# Patient Record
Sex: Male | Born: 1960 | Race: White | Hispanic: No | Marital: Married | Smoking: Former smoker
Health system: Southern US, Community
[De-identification: ages and names within clinical notes are randomized; demographics above are authoritative.]

## PROBLEM LIST (undated history)

## (undated) DIAGNOSIS — I10 Essential (primary) hypertension: Secondary | ICD-10-CM

## (undated) HISTORY — PX: KNEE SURGERY: SHX244

## (undated) NOTE — *Deleted (*Deleted)
NEUROLOGY CONSULTATION NOTE   Date of service: July 25, 2020 Patient Name: Larry Vega MRN:  [id number] DOB:  August 23, 1961 Reason for consult: "L sided numbness"  History of Present Illness  Larry Vega is a 13 y.o. male with PMH significant for Hypertension who presents with  L facial numbness and having to drink through the R side of the mouth and subjective left eye twitch. 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 He was given Aspirin and Plavix in the ED.  Suspicion for potential symptomatic stenosis vs stroke and therefore he was transferred to [facility] for an MRI Brain and further workup.   ROS   Constitutional Denies weight loss, fever and chills. ***  HEENT Denies changes in vision and hearing. ***  Respiratory Denies SOB and cough. ***  CV Denies palpitations and CP ***  GI Denies abdominal pain, nausea, vomiting and diarrhea. ***  GU Denies dysuria and urinary frequency. ***  MSK Denies myalgia and joint pain. ***  Skin Denies rash and pruritus. ***  Neurological Denies headache and syncope. ***  Psychiatric Denies recent changes in mood. Denies anxiety and depression. ***   Past History   Past Medical History:  Diagnosis Date  . Hypertension    Past Surgical History:  Procedure Laterality Date  . KNEE SURGERY     Family History  Problem Relation Age of Onset  . Cancer Mother   . Hypertension Neg Hx   . Diabetes Neg Hx   . Hyperlipidemia Neg Hx   . Heart disease Neg Hx    Social History   Socioeconomic History  . Marital status: Married    Spouse name: Not on file  . Number of children: Not on file  . Years of education: Not on file  . Highest education level: Not on file  Occupational History  . Not on file  Tobacco Use  . Smoking status: Light Tobacco Smoker  . Smokeless tobacco: Never Used  Substance and Sexual Activity  . Alcohol use: Yes     Comment: occasionally  . Drug use: No  . Sexual activity: Not on file  Other Topics Concern  . Not on file  Social History Narrative  . Not on file   Social Determinants of Health   Financial Resource Strain:   . Difficulty of Paying Living Expenses: Not on file  Food Insecurity:   . Worried About Programme researcher, broadcasting/film/video in the Last Year: Not on file  . Ran Out of Food in the Last Year: Not on file  Transportation Needs:   . Lack of Transportation (Medical): Not on file  . Lack of Transportation (Non-Medical): Not on file  Physical Activity:   . Days of Exercise per Week: Not on file  . Minutes of Exercise per Session: Not on file  Stress:   . Feeling of Stress : Not on file  Social Connections:   . Frequency of Communication with Friends and Family: Not on file  . Frequency of Social Gatherings with Friends and Family: Not on file  . Attends Religious Services: Not on file  . Active Member of Clubs or Organizations: Not on file  . Attends Banker Meetings: Not on file  . Marital Status: Not on file   Allergies  Allergen Reactions  . Penicillins     Unknown = severe   . Sulfa Antibiotics  Anaphylaxis     Medications   Medications Prior to Admission  Medication Sig Dispense Refill Last Dose  . azithromycin (ZITHROMAX) 250 MG tablet Take 1 tablet (250 mg total) by mouth daily. 4 tablet 0      Vitals   Vitals:   07/25/20 1700 07/25/20 1915 07/25/20 1957 07/25/20 2105  BP: (!) 148/78 138/84 (!) 161/81 (!) 163/83  Pulse: 73 67 65 62  Resp: (!) 25 (!) 21 15 (!) 22  Temp:      TempSrc:      SpO2: 95% 98% 99% 98%  Weight:      Height:         Body mass index is 33.4 kg/m.  Physical Exam   General: Laying comfortably in bed; in no acute distress. *** HENT: Normal oropharynx and mucosa. Normal external appearance of ears and nose. *** Neck: Supple, no pain or tenderness *** CV: No JVD. No peripheral edema. *** Pulmonary: Symmetric Chest rise.  Normal respiratory effort. *** Abdomen: Soft to touch, non-tender. *** Ext: No cyanosis, edema, or deformity *** Skin: No rash. Normal palpation of skin.  *** Musculoskeletal: Normal digits and nails by inspection. No clubbing. ***  Neurologic Examination  Mental status/Cognition: Alert, oriented to self, place, month and year, good attention. *** Speech/language: Fluent, comprehension intact, object naming intact, repetition intact. *** Cranial nerves:   CN II Pupils equal and reactive to light, no VF deficits ***   CN III,IV,VI EOM intact, no gaze preference or deviation, no nystagmus ***   CN V normal sensation in V1, V2, and V3 segments bilaterally ***   CN VII no asymmetry, no nasolabial fold flattening ***   CN VIII normal hearing to speech ***   CN IX & X normal palatal elevation, no uvular deviation ***   CN XI 5/5 head turn and 5/5 shoulder shrug bilaterally ***   CN XII midline tongue protrusion ***   Motor:  Muscle bulk: ***, tone ***, pronator drift *** tremor *** Mvmt Root Nerve  Muscle Right Left Comments  SA C5/6 Ax Deltoid     EF C5/6 Mc Biceps     EE C6/7/8 Rad Triceps     WF C6/7 Med FCR     WE C7/8 PIN ECU     F Ab C8/T1 U ADM/FDI     HF L1/2/3 Fem Illopsoas     KE L2/3/4 Fem Quad     DF L4/5 D Peron Tib Ant     PF S1/2 Tibial Grc/Sol      Reflexes:  Right Left Comments  Pectoralis      Biceps (C5/6)     Brachioradialis (C5/6)      Triceps (C6/7)      Patellar (L3/4)      Achilles (S1)      Hoffman      Plantar     Jaw jerk    Sensation:  Light touch    Pin prick    Temperature    Vibration   Proprioception    Coordination/Complex Motor:  - Finger to Nose *** - Heel to shin *** - Rapid alternating movement *** - Gait: Stride length ***. Arm swing ***. Base width ***  Labs   CBC:  Recent Labs  Lab 07/25/20 1459  WBC 6.8  NEUTROABS 4.1  HGB 13.7  HCT 40.8  MCV 98.3  PLT 199    Basic Metabolic Panel:  Lab Results  Component  Value Date   NA 139 07/25/2020  K 3.9 07/25/2020   CO2 26 07/25/2020   GLUCOSE 112 (H) 07/25/2020   BUN 18 07/25/2020   CREATININE 1.12 07/25/2020   CALCIUM 9.0 07/25/2020   GFRNONAA >60 07/25/2020   Lipid Panel: No results found for: LDLCALC HgbA1c: No results found for: HGBA1C Urine Drug Screen: No results found for: LABOPIA, COCAINSCRNUR, LABBENZ, AMPHETMU, THCU, LABBARB  Alcohol Level     Component Value Date/Time   ETH <10 07/25/2020 1459     Results for orders placed during the hospital encounter of 07/25/20  CT Angio Neck W and/or Wo Contrast  Narrative CLINICAL DATA:  Left facial twitching and numbness.  EXAM: CT ANGIOGRAPHY HEAD AND NECK  TECHNIQUE: Multidetector CT imaging of the head and neck was performed using the standard protocol during bolus administration of intravenous contrast. Multiplanar CT image reconstructions and MIPs were obtained to evaluate the vascular anatomy. Carotid stenosis measurements (when applicable) are obtained utilizing NASCET criteria, using the distal internal carotid diameter as the denominator.  CONTRAST:  OMNIPAQUE IOHEXOL 350 MG/ML SOLN  COMPARISON:  None.  FINDINGS: CTA NECK FINDINGS  Aortic arch: Standard 3 vessel aortic arch with widely patent arch vessel origins.  Right carotid system: Patent with mild calcified and soft plaque at the carotid bifurcation. No evidence of significant stenosis or dissection.  Left carotid system: Patent with mild calcified and soft plaque at the carotid bifurcation. No evidence of significant stenosis or dissection.  Vertebral arteries: Patent and codominant. Suboptimal assessment of the vertebral artery origins bilaterally due to shoulder artifact. No evidence of significant stenosis or dissection more distally.  Skeleton: Mild cervical spondylosis and facet arthrosis.  Other neck: No evidence of cervical lymphadenopathy or mass.  Upper chest: Clear lung apices.   Review of the MIP images confirms the above findings  CTA HEAD FINDINGS  Anterior circulation: The internal carotid arteries are patent from skull base to carotid termini with minimal nonstenotic plaque. ACAs and MCAs are patent without evidence of a proximal branch occlusion or significant A1 or M1 stenosis. There is a moderate to severe stenosis of the right M2 superior division proximally. No aneurysm is identified.  Posterior circulation: The intracranial vertebral arteries are widely patent to the basilar. Patent PICA and SCA origins are identified bilaterally. The basilar artery is widely patent. There is a fetal origin of the right PCA. Both PCAs are patent without evidence of a significant proximal stenosis. No aneurysm is identified.  Venous sinuses: Patent.  Anatomic variants: Fetal right PCA.  Review of the MIP images confirms the above findings  IMPRESSION: 1. No large vessel occlusion. 2. 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 3. Mild cervical carotid artery atherosclerosis without stenosis.   Electronically Signed By: Sebastian Ache M.D. On: 07/25/2020 18:55    Impression   Tierra Divelbiss is a 43 y.o. male with PMH significant for ***. His neurologic examination is notable for ***.  Recommendations  *** ______________________________________________________________________   Thank you for the opportunity to take part in the care of this patient. If you have any further questions, please contact the neurology consultation attending.  Signed,  Erick Blinks [facility] Pager Number [phone]

---

## 2013-11-02 ENCOUNTER — Encounter (HOSPITAL_BASED_OUTPATIENT_CLINIC_OR_DEPARTMENT_OTHER): Payer: Self-pay | Admitting: Emergency Medicine

## 2013-11-02 ENCOUNTER — Emergency Department (HOSPITAL_BASED_OUTPATIENT_CLINIC_OR_DEPARTMENT_OTHER)
Admission: EM | Admit: 2013-11-02 | Discharge: 2013-11-02 | Disposition: A | Discharge status: Home / Self Care | Payer: Managed Care, Other (non HMO) | Attending: Emergency Medicine | Admitting: Emergency Medicine

## 2013-11-02 DIAGNOSIS — I10 Essential (primary) hypertension: Secondary | ICD-10-CM | POA: Insufficient documentation

## 2013-11-02 DIAGNOSIS — R51 Headache: Secondary | ICD-10-CM | POA: Insufficient documentation

## 2013-11-02 DIAGNOSIS — Z880 Allergy status to penicillin: Secondary | ICD-10-CM | POA: Insufficient documentation

## 2013-11-02 DIAGNOSIS — R519 Headache, unspecified: Secondary | ICD-10-CM

## 2013-11-02 DIAGNOSIS — F17200 Nicotine dependence, unspecified, uncomplicated: Secondary | ICD-10-CM | POA: Insufficient documentation

## 2013-11-02 DIAGNOSIS — J329 Chronic sinusitis, unspecified: Secondary | ICD-10-CM | POA: Insufficient documentation

## 2013-11-02 MED ORDER — AZITHROMYCIN 250 MG PO TABS: 250.0000 mg | ORAL_TABLET | Freq: Every day | ORAL | Status: DC

## 2013-11-02 MED ORDER — AZITHROMYCIN 250 MG PO TABS
500.0000 mg | ORAL_TABLET | Freq: Once | ORAL | Status: AC
  Administered 2013-11-02: 500 mg via ORAL
  Filled 2013-11-02: qty 2

## 2013-11-02 NOTE — ED Notes (Signed)
Pt c/o migraine, sinus pressure, nasal congestion, states he took BP at home and it was 175/104 at home.

## 2013-11-02 NOTE — ED Provider Notes (Signed)
CSN: [id number]     Arrival date & time 11/02/13  4098 History   First MD Initiated Contact with Patient 11/02/13 0441     Chief Complaint  Patient presents with  . Nasal Congestion  . Headache  . Hypertension     (Consider location/radiation/quality/duration/timing/severity/associated sxs/prior Treatment) HPI Comments: Patient is a 53 year old male with past medical history of deviated septum.  He presents today with complaints of sinus congestion and headache that has been ongoing for the past week and getting worse. He denies any fevers or chills. He woke from sleep this evening and felt congested and as if he could not breathe. He checked his blood pressure and it was found to be elevated. He was concerned about this so presents here for evaluation. He denies any chest pain or shortness of breath. He does not have any history of hypertension and is on no medications.  Patient is a 53 y.o. male presenting with headaches and hypertension. The history is provided by the patient.  Headache Pain location:  Frontal Quality:  Dull Radiates to:  Does not radiate Onset quality:  Gradual Duration:  1 week Timing:  Constant Progression:  Worsening Chronicity:  New Similar to prior headaches: yes   Relieved by:  Nothing Worsened by:  Nothing tried Ineffective treatments:  None tried Hypertension Associated symptoms include headaches.    History reviewed. No pertinent past medical history. Past Surgical History  Procedure Laterality Date  . Knee surgery     History reviewed. No pertinent family history. History  Substance Use Topics  . Smoking status: Light Tobacco Smoker  . Smokeless tobacco: Not on file  . Alcohol Use: Yes     Comment: occasionally    Review of Systems  Neurological: Positive for headaches.  All other systems reviewed and are negative.      Allergies  Penicillins and Sulfa antibiotics  Home Medications  No current outpatient prescriptions on  file. BP 183/94  Pulse 86  Temp(Src) 97.6 F (36.4 C) (Oral)  Resp 18  Ht 5\' 9"  (1.753 m)  Wt 300 lb (136.079 kg)  BMI 44.28 kg/m2  SpO2 96% Physical Exam  Nursing note reviewed. Constitutional: He is oriented to person, place, and time. He appears well-developed and well-nourished. No distress.  HENT:  Head: Normocephalic and atraumatic.  Mouth/Throat: Oropharynx is clear and moist.  Eyes: EOM are normal. Pupils are equal, round, and reactive to light.  Neck: Normal range of motion. Neck supple.  Cardiovascular: Normal rate, regular rhythm and normal heart sounds.   No murmur heard. Pulmonary/Chest: Breath sounds normal. No respiratory distress. He has no wheezes.  Abdominal: Soft. Bowel sounds are normal. He exhibits no distension. There is no tenderness.  Musculoskeletal: Normal range of motion. He exhibits no edema.  Lymphadenopathy:    He has no cervical adenopathy.  Neurological: He is alert and oriented to person, place, and time. No cranial nerve deficit. Coordination normal.  Skin: Skin is warm and dry. He is not diaphoretic.    ED Course  Procedures (including critical care time) Labs Review Labs Reviewed - No data to display Imaging Review No results found.    MDM   Final diagnoses:  None    Patient presents here with complaints of elevated blood pressure, headache, and sinus congestion. He believes he has a sinus infection. This will be treated with Zithromax. He is also concerned about his blood pressure. It was initially elevated, however after being triaged significantly improved. While I examined him  his blood pressure was 158/89. I do not feel that this warrants any emergent treatment however I have advised him to keep a record of this and discuss the results with his primary Dr. Neurologic examination is nonfocal and I see no indication for further imaging.    Geoffery Lyonsouglas Desirai Traxler, MD 11/02/13 [phone]

## 2013-11-02 NOTE — Discharge Instructions (Signed)
Zithromax as prescribed.  Measure your blood pressure regularly for the next several days and follow up these results with your primary Dr.  Return to the emergency department if you develop severe headache, high fever, or other new or concerning symptoms   Sinusitis Sinusitis is redness, soreness, and swelling (inflammation) of the paranasal sinuses. Paranasal sinuses are air pockets within the bones of your face (beneath the eyes, the middle of the forehead, or above the eyes). In healthy paranasal sinuses, mucus is able to drain out, and air is able to circulate through them by way of your nose. However, when your paranasal sinuses are inflamed, mucus and air can become trapped. This can allow bacteria and other germs to grow and cause infection. Sinusitis can develop quickly and last only a short time (acute) or continue over a long period (chronic). Sinusitis that lasts for more than 12 weeks is considered chronic.  CAUSES  Causes of sinusitis include:  Allergies.  Structural abnormalities, such as displacement of the cartilage that separates your nostrils (deviated septum), which can decrease the air flow through your nose and sinuses and affect sinus drainage.  Functional abnormalities, such as when the small hairs (cilia) that line your sinuses and help remove mucus do not work properly or are not present. SYMPTOMS  Symptoms of acute and chronic sinusitis are the same. The primary symptoms are pain and pressure around the affected sinuses. Other symptoms include:  Upper toothache.  Earache.  Headache.  Bad breath.  Decreased sense of smell and taste.  A cough, which worsens when you are lying flat.  Fatigue.  Fever.  Thick drainage from your nose, which often is green and may contain pus (purulent).  Swelling and warmth over the affected sinuses. DIAGNOSIS  Your caregiver will perform a physical exam. During the exam, your caregiver may:  Look in your nose for signs  of abnormal growths in your nostrils (nasal polyps).  Tap over the affected sinus to check for signs of infection.  View the inside of your sinuses (endoscopy) with a special imaging device with a light attached (endoscope), which is inserted into your sinuses. If your caregiver suspects that you have chronic sinusitis, one or more of the following tests may be recommended:  Allergy tests.  Nasal culture A sample of mucus is taken from your nose and sent to a lab and screened for bacteria.  Nasal cytology A sample of mucus is taken from your nose and examined by your caregiver to determine if your sinusitis is related to an allergy. TREATMENT  Most cases of acute sinusitis are related to a viral infection and will resolve on their own within 10 days. Sometimes medicines are prescribed to help relieve symptoms (pain medicine, decongestants, nasal steroid sprays, or saline sprays).  However, for sinusitis related to a bacterial infection, your caregiver will prescribe antibiotic medicines. These are medicines that will help kill the bacteria causing the infection.  Rarely, sinusitis is caused by a fungal infection. In theses cases, your caregiver will prescribe antifungal medicine. For some cases of chronic sinusitis, surgery is needed. Generally, these are cases in which sinusitis recurs more than 3 times per year, despite other treatments. HOME CARE INSTRUCTIONS   Drink plenty of water. Water helps thin the mucus so your sinuses can drain more easily.  Use a humidifier.  Inhale steam 3 to 4 times a day (for example, sit in the bathroom with the shower running).  Apply a warm, moist washcloth to your face  3 to 4 times a day, or as directed by your caregiver.  Use saline nasal sprays to help moisten and clean your sinuses.  Take over-the-counter or prescription medicines for pain, discomfort, or fever only as directed by your caregiver. SEEK IMMEDIATE MEDICAL CARE IF:  You have  increasing pain or severe headaches.  You have nausea, vomiting, or drowsiness.  You have swelling around your face.  You have vision problems.  You have a stiff neck.  You have difficulty breathing. MAKE SURE YOU:   Understand these instructions.  Will watch your condition.  Will get help right away if you are not doing well or get worse. Document Released: 08/28/2005 Document Revised: 11/20/2011 Document Reviewed: 09/12/2011 [facility] Patient Information 2014 [place], [place].

## 2020-07-22 DIAGNOSIS — I10 Essential (primary) hypertension: Secondary | ICD-10-CM | POA: Diagnosis not present

## 2020-07-22 DIAGNOSIS — Z760 Encounter for issue of repeat prescription: Secondary | ICD-10-CM | POA: Diagnosis not present

## 2020-07-25 ENCOUNTER — Inpatient Hospital Stay (HOSPITAL_BASED_OUTPATIENT_CLINIC_OR_DEPARTMENT_OTHER)
Admission: EM | Admit: 2020-07-25 | Discharge: 2020-07-26 | DRG: 923 | Disposition: A | Discharge status: Home / Self Care | Payer: Self-pay | Attending: Internal Medicine | Admitting: Internal Medicine

## 2020-07-25 ENCOUNTER — Inpatient Hospital Stay (HOSPITAL_COMMUNITY): Payer: Managed Care, Other (non HMO)

## 2020-07-25 ENCOUNTER — Other Ambulatory Visit: Payer: Self-pay

## 2020-07-25 ENCOUNTER — Emergency Department (HOSPITAL_BASED_OUTPATIENT_CLINIC_OR_DEPARTMENT_OTHER): Payer: Managed Care, Other (non HMO)

## 2020-07-25 ENCOUNTER — Encounter (HOSPITAL_BASED_OUTPATIENT_CLINIC_OR_DEPARTMENT_OTHER): Payer: Self-pay | Admitting: Emergency Medicine

## 2020-07-25 DIAGNOSIS — G510 Bell's palsy: Secondary | ICD-10-CM

## 2020-07-25 DIAGNOSIS — I639 Cerebral infarction, unspecified: Secondary | ICD-10-CM | POA: Diagnosis present

## 2020-07-25 DIAGNOSIS — T7029XA Other effects of high altitude, initial encounter: Principal | ICD-10-CM | POA: Diagnosis present

## 2020-07-25 DIAGNOSIS — F17210 Nicotine dependence, cigarettes, uncomplicated: Secondary | ICD-10-CM | POA: Diagnosis present

## 2020-07-25 DIAGNOSIS — Z880 Allergy status to penicillin: Secondary | ICD-10-CM

## 2020-07-25 DIAGNOSIS — Z20822 Contact with and (suspected) exposure to covid-19: Secondary | ICD-10-CM | POA: Diagnosis present

## 2020-07-25 DIAGNOSIS — I10 Essential (primary) hypertension: Secondary | ICD-10-CM | POA: Diagnosis present

## 2020-07-25 DIAGNOSIS — I6601 Occlusion and stenosis of right middle cerebral artery: Principal | ICD-10-CM | POA: Diagnosis present

## 2020-07-25 DIAGNOSIS — Z809 Family history of malignant neoplasm, unspecified: Secondary | ICD-10-CM

## 2020-07-25 DIAGNOSIS — R202 Paresthesia of skin: Secondary | ICD-10-CM

## 2020-07-25 DIAGNOSIS — Z882 Allergy status to sulfonamides status: Secondary | ICD-10-CM

## 2020-07-25 DIAGNOSIS — R29810 Facial weakness: Secondary | ICD-10-CM

## 2020-07-25 DIAGNOSIS — G5139 Clonic hemifacial spasm, unspecified: Secondary | ICD-10-CM

## 2020-07-25 HISTORY — DX: Essential (primary) hypertension: I10

## 2020-07-25 LAB — CBC
HCT: 40.8 % (ref 39.0–52.0)
Hemoglobin: 13.7 g/dL (ref 13.0–17.0)
MCH: 33 pg (ref 26.0–34.0)
MCHC: 33.6 g/dL (ref 30.0–36.0)
MCV: 98.3 fL (ref 80.0–100.0)
Platelets: 199 10*3/uL (ref 150–400)
RBC: 4.15 MIL/uL — ABNORMAL LOW (ref 4.22–5.81)
RDW: 13.3 % (ref 11.5–15.5)
WBC: 6.8 10*3/uL (ref 4.0–10.5)
nRBC: 0 % (ref 0.0–0.2)

## 2020-07-25 LAB — PROTIME-INR
INR: 1 (ref 0.8–1.2)
Prothrombin Time: 13.1 seconds (ref 11.4–15.2)

## 2020-07-25 LAB — COMPREHENSIVE METABOLIC PANEL
ALT: 17 U/L (ref 0–44)
AST: 18 U/L (ref 15–41)
Albumin: 4.2 g/dL (ref 3.5–5.0)
Alkaline Phosphatase: 47 U/L (ref 38–126)
Anion gap: 9 (ref 5–15)
BUN: 18 mg/dL (ref 6–20)
CO2: 26 mmol/L (ref 22–32)
Calcium: 9 mg/dL (ref 8.9–10.3)
Chloride: 104 mmol/L (ref 98–111)
Creatinine, Ser: 1.12 mg/dL (ref 0.61–1.24)
GFR, Estimated: >60 mL/min (ref >60)
Glucose, Bld: 112 mg/dL — ABNORMAL HIGH (ref 70–99)
Potassium: 3.9 mmol/L (ref 3.5–5.1)
Sodium: 139 mmol/L (ref 135–145)
Total Bilirubin: 0.4 mg/dL (ref 0.3–1.2)
Total Protein: 7.3 g/dL (ref 6.5–8.1)

## 2020-07-25 LAB — DIFFERENTIAL
Abs Immature Granulocytes: 0.01 10*3/uL (ref 0.00–0.07)
Basophils Absolute: 0.1 10*3/uL (ref 0.0–0.1)
Basophils Relative: 1 %
Eosinophils Absolute: 0.2 10*3/uL (ref 0.0–0.5)
Eosinophils Relative: 3 %
Immature Granulocytes: 0 %
Lymphocytes Relative: 27 %
Lymphs Abs: 1.8 10*3/uL (ref 0.7–4.0)
Monocytes Absolute: 0.7 10*3/uL (ref 0.1–1.0)
Monocytes Relative: 10 %
Neutro Abs: 4.1 10*3/uL (ref 1.7–7.7)
Neutrophils Relative %: 59 %

## 2020-07-25 LAB — RESPIRATORY PANEL BY RT PCR (FLU A&B, COVID)
Influenza A by PCR: NEGATIVE
Influenza B by PCR: NEGATIVE
SARS Coronavirus 2 by RT PCR: NEGATIVE

## 2020-07-25 LAB — APTT: aPTT: 31 seconds (ref 24–36)

## 2020-07-25 LAB — ETHANOL: Alcohol, Ethyl (B): <10 mg/dL (ref <10)

## 2020-07-25 MED ORDER — ACETAMINOPHEN 325 MG PO TABS: 650.0000 mg | ORAL_TABLET | ORAL | Status: DC | PRN

## 2020-07-25 MED ORDER — LORAZEPAM 1 MG PO TABS
1.0000 mg | ORAL_TABLET | Freq: Once | ORAL | Status: DC | PRN
  Filled 2020-07-25: qty 1

## 2020-07-25 MED ORDER — IOHEXOL 350 MG/ML SOLN
100.0000 mL | Freq: Once | INTRAVENOUS | Status: AC | PRN
  Administered 2020-07-25: 100 mL via INTRAVENOUS

## 2020-07-25 MED ORDER — ACETAMINOPHEN 650 MG RE SUPP: 650.0000 mg | RECTAL | Status: DC | PRN

## 2020-07-25 MED ORDER — CLOPIDOGREL BISULFATE 75 MG PO TABS
75.0000 mg | ORAL_TABLET | Freq: Every day | ORAL | Status: DC
  Administered 2020-07-25: 75 mg via ORAL
  Filled 2020-07-25: qty 1

## 2020-07-25 MED ORDER — ASPIRIN 300 MG RE SUPP: 300.0000 mg | Freq: Every day | RECTAL | Status: DC

## 2020-07-25 MED ORDER — SODIUM CHLORIDE 0.9 % IV BOLUS
1000.0000 mL | Freq: Once | INTRAVENOUS | Status: AC
  Administered 2020-07-25: 1000 mL via INTRAVENOUS

## 2020-07-25 MED ORDER — ASPIRIN 81 MG PO CHEW
324.0000 mg | CHEWABLE_TABLET | Freq: Every day | ORAL | Status: DC
  Administered 2020-07-25: 324 mg via ORAL
  Filled 2020-07-25: qty 4

## 2020-07-25 MED ORDER — ENOXAPARIN SODIUM 40 MG/0.4ML ~~LOC~~ SOLN
40.0000 mg | SUBCUTANEOUS | Status: DC
  Administered 2020-07-25: 40 mg via SUBCUTANEOUS
  Filled 2020-07-25: qty 0.4

## 2020-07-25 MED ORDER — SODIUM CHLORIDE 0.9 % IV SOLN: INTRAVENOUS | Status: DC

## 2020-07-25 MED ORDER — STROKE: EARLY STAGES OF RECOVERY BOOK
Freq: Once | Status: AC
  Filled 2020-07-25: qty 1

## 2020-07-25 MED ORDER — ACETAMINOPHEN 160 MG/5ML PO SOLN: 650.0000 mg | ORAL | Status: DC | PRN

## 2020-07-25 NOTE — H&P (Addendum)
History and Physical    Larry Vega QQV:[id number] DOB: 16-Feb-1961 DOA: 07/25/2020  PCP: Sigmund Hazel, MD  Patient coming from: Home  I have personally briefly reviewed patient's old medical records in [facility]  Chief Complaint: L cheek numbness  HPI: Larry Vega is a 59 y.o. male with medical history significant of HTN, smoking.  Pt presents to the ED at [facility] with c/o L cheek numbness, L face weakness, L eye twitching.  Symptoms onset 11am yesterday.  Felt a "pop" in his left ear.  Symptoms are persistent.  No weakness or numbness to any extremities, no vision changes no speech changes.  Does have headache.   ED Course: CTA of head and neck was concerning for a L M2 mod-severe stenosis.  Pt given ASA+Plavix.  Transferred to [facility].   Review of Systems: As per HPI, otherwise all review of systems negative.  Past Medical History:  Diagnosis Date  . Hypertension     Past Surgical History:  Procedure Laterality Date  . KNEE SURGERY       reports that he has been smoking. He has never used smokeless tobacco. He reports current alcohol use. He reports that he does not use drugs.  Allergies  Allergen Reactions  . Penicillins     Unknown = severe   . Sulfa Antibiotics     Anaphylaxis     Family History  Problem Relation Age of Onset  . Cancer Mother   . Hypertension Neg Hx   . Diabetes Neg Hx   . Hyperlipidemia Neg Hx   . Heart disease Neg Hx      Prior to Admission medications   Medication Sig Start Date End Date Taking? Authorizing Provider  azithromycin (ZITHROMAX) 250 MG tablet Take 1 tablet (250 mg total) by mouth daily. 11/02/13   Geoffery Lyons, MD    Physical Exam: Vitals:   07/25/20 1700 07/25/20 1915 07/25/20 1957 07/25/20 2105  BP: (!) 148/78 138/84 (!) 161/81 (!) 163/83  Pulse: 73 67 65 62  Resp: (!) 25 (!) 21 15 (!) 22  Temp:      TempSrc:      SpO2: 95% 98% 99% 98%  Weight:      Height:        Constitutional: NAD, calm,  comfortable Eyes: PERRL, lids and conjunctivae normal ENMT: Mucous membranes are moist. Posterior pharynx clear of any exudate or lesions.Normal dentition.  Neck: normal, supple, no masses, no thyromegaly Respiratory: clear to auscultation bilaterally, no wheezing, no crackles. Normal respiratory effort. No accessory muscle use.  Cardiovascular: Regular rate and rhythm, no murmurs / rubs / gallops. No extremity edema. 2+ pedal pulses. No carotid bruits.  Abdomen: no tenderness, no masses palpated. No hepatosplenomegaly. Bowel sounds positive.  Musculoskeletal: no clubbing / cyanosis. No joint deformity upper and lower extremities. Good ROM, no contractures. Normal muscle tone.  Skin: no rashes, lesions, ulcers. No induration Neurologic: L facial droop present, 5/5 strength in all extremities, speech clear without aphasia Psychiatric: Normal judgment and insight. Alert and oriented x 3. Normal mood.    Labs on Admission: I have personally reviewed following labs and imaging studies  CBC: Recent Labs  Lab 07/25/20 1459  WBC 6.8  NEUTROABS 4.1  HGB 13.7  HCT 40.8  MCV 98.3  PLT 199   Basic Metabolic Panel: Recent Labs  Lab 07/25/20 1459  NA 139  K 3.9  CL 104  CO2 26  GLUCOSE 112*  BUN 18  CREATININE 1.12  CALCIUM 9.0  GFR: Estimated Creatinine Clearance: 84.9 mL/min (by C-G formula based on SCr of 1.12 mg/dL). Liver Function Tests: Recent Labs  Lab 07/25/20 1459  AST 18  ALT 17  ALKPHOS 47  BILITOT 0.4  PROT 7.3  ALBUMIN 4.2   No results for input(s): LIPASE, AMYLASE in the last 168 hours. No results for input(s): AMMONIA in the last 168 hours. Coagulation Profile: Recent Labs  Lab 07/25/20 1459  INR 1.0   Cardiac Enzymes: No results for input(s): CKTOTAL, CKMB, CKMBINDEX, TROPONINI in the last 168 hours. BNP (last 3 results) No results for input(s): PROBNP in the last 8760 hours. HbA1C: No results for input(s): HGBA1C in the last 72 hours. CBG: No  results for input(s): GLUCAP in the last 168 hours. Lipid Profile: No results for input(s): CHOL, HDL, LDLCALC, TRIG, CHOLHDL, LDLDIRECT in the last 72 hours. Thyroid Function Tests: No results for input(s): TSH, T4TOTAL, FREET4, T3FREE, THYROIDAB in the last 72 hours. Anemia Panel: No results for input(s): VITAMINB12, FOLATE, FERRITIN, TIBC, IRON, RETICCTPCT in the last 72 hours. Urine analysis: No results found for: COLORURINE, APPEARANCEUR, LABSPEC, PHURINE, GLUCOSEU, HGBUR, BILIRUBINUR, KETONESUR, PROTEINUR, UROBILINOGEN, NITRITE, LEUKOCYTESUR  Radiological Exams on Admission: CT Angio Head W or Wo Contrast  Result Date: 07/25/2020 CLINICAL DATA:  Left facial twitching and numbness. EXAM: CT ANGIOGRAPHY HEAD AND NECK TECHNIQUE: Multidetector CT imaging of the head and neck was performed using the standard protocol during bolus administration of intravenous contrast. Multiplanar CT image reconstructions and MIPs were obtained to evaluate the vascular anatomy. Carotid stenosis measurements (when applicable) are obtained utilizing NASCET criteria, using the distal internal carotid diameter as the denominator. CONTRAST:  100mL OMNIPAQUE IOHEXOL 350 MG/ML SOLN COMPARISON:  None. FINDINGS: CTA NECK FINDINGS Aortic arch: Standard 3 vessel aortic arch with widely patent arch vessel origins. Right carotid system: Patent with mild calcified and soft plaque at the carotid bifurcation. No evidence of significant stenosis or dissection. Left carotid system: Patent with mild calcified and soft plaque at the carotid bifurcation. No evidence of significant stenosis or dissection. Vertebral arteries: Patent and codominant. Suboptimal assessment of the vertebral artery origins bilaterally due to shoulder artifact. No evidence of significant stenosis or dissection more distally. Skeleton: Mild cervical spondylosis and facet arthrosis. Other neck: No evidence of cervical lymphadenopathy or mass. Upper chest: Clear  lung apices. Review of the MIP images confirms the above findings CTA HEAD FINDINGS Anterior circulation: The internal carotid arteries are patent from skull base to carotid termini with minimal nonstenotic plaque. ACAs and MCAs are patent without evidence of a proximal branch occlusion or significant A1 or M1 stenosis. There is a moderate to severe stenosis of the right M2 superior division proximally. No aneurysm is identified. Posterior circulation: The intracranial vertebral arteries are widely patent to the basilar. Patent PICA and SCA origins are identified bilaterally. The basilar artery is widely patent. There is a fetal origin of the right PCA. Both PCAs are patent without evidence of a significant proximal stenosis. No aneurysm is identified. Venous sinuses: Patent. Anatomic variants: Fetal right PCA. Review of the MIP images confirms the above findings IMPRESSION: 1. No large vessel occlusion. 2. 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 3. Mild cervical carotid artery atherosclerosis without stenosis. Electronically Signed   By: Sebastian AcheAllen  Grady M.D.   On: 07/25/2020 18:55   CT HEAD WO CONTRAST  Result Date: 07/25/2020 CLINICAL DATA:  LEFT-sided facial numbness since yesterday EXAM: CT HEAD WITHOUT CONTRAST TECHNIQUE: Contiguous axial images were obtained from the  base of the skull through the vertex without intravenous contrast. COMPARISON:  None. FINDINGS: Brain: No evidence of acute infarction, hemorrhage, hydrocephalus, extra-axial collection or mass lesion/mass effect. Vascular: No hyperdense vessel or unexpected calcification. Skull: Normal. Negative for fracture or focal lesion. Sinuses/Orbits: No acute finding. Other: None. IMPRESSION: No acute intracranial abnormality. Electronically Signed   By: Meda Klinefelter MD   On: 07/25/2020 16:42   CT Angio Neck W and/or Wo Contrast  Result Date: 07/25/2020 CLINICAL DATA:  Left facial twitching and numbness. EXAM: CT ANGIOGRAPHY HEAD AND  NECK TECHNIQUE: Multidetector CT imaging of the head and neck was performed using the standard protocol during bolus administration of intravenous contrast. Multiplanar CT image reconstructions and MIPs were obtained to evaluate the vascular anatomy. Carotid stenosis measurements (when applicable) are obtained utilizing NASCET criteria, using the distal internal carotid diameter as the denominator. CONTRAST:  OMNIPAQUE IOHEXOL 350 MG/ML SOLN COMPARISON:  None. FINDINGS: CTA NECK FINDINGS Aortic arch: Standard 3 vessel aortic arch with widely patent arch vessel origins. Right carotid system: Patent with mild calcified and soft plaque at the carotid bifurcation. No evidence of significant stenosis or dissection. Left carotid system: Patent with mild calcified and soft plaque at the carotid bifurcation. No evidence of significant stenosis or dissection. Vertebral arteries: Patent and codominant. Suboptimal assessment of the vertebral artery origins bilaterally due to shoulder artifact. No evidence of significant stenosis or dissection more distally. Skeleton: Mild cervical spondylosis and facet arthrosis. Other neck: No evidence of cervical lymphadenopathy or mass. Upper chest: Clear lung apices. Review of the MIP images confirms the above findings CTA HEAD FINDINGS Anterior circulation: The internal carotid arteries are patent from skull base to carotid termini with minimal nonstenotic plaque. ACAs and MCAs are patent without evidence of a proximal branch occlusion or significant A1 or M1 stenosis. There is a moderate to severe stenosis of the right M2 superior division proximally. No aneurysm is identified. Posterior circulation: The intracranial vertebral arteries are widely patent to the basilar. Patent PICA and SCA origins are identified bilaterally. The basilar artery is widely patent. There is a fetal origin of the right PCA. Both PCAs are patent without evidence of a significant proximal stenosis. No  aneurysm is identified. Venous sinuses: Patent. Anatomic variants: Fetal right PCA. Review of the MIP images confirms the above findings IMPRESSION: 1. No large vessel occlusion. 2. 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 3. Mild cervical carotid artery atherosclerosis without stenosis. Electronically Signed   By: Sebastian Ache M.D.   On: 07/25/2020 18:55    EKG: Independently reviewed.  Assessment/Plan Principal Problem:   Acute ischemic stroke [facility]) Active Problems:   HTN (hypertension)   Middle cerebral artery stenosis, right    1. Acute ischemic stroke - 1. Concern for R MCA territory infract causing his L facial droop 2. Stroke pathway 3. MRI 4. 2d echo 5. Tele monitor 6. Neuro consult: 1. ASA+Plavix 2. Bed rest: HOB flat for now 3. Permissive HTN 4. IVF 7. SLP (cant order PT/OT since he is bed rest at the moment) 8. Will keep NPO till neuro clears him to sit up and eat 9. IVF 10. A1C and FLP 2. R MCA M2 stenosis - 1. This could explain his symptoms 2. Could be pressure dependent 3. HTN - 1. Hold all home BP meds  DVT prophylaxis: Lovenox Code Status: Full Family Communication: No family in room Disposition Plan: Home after stroke work up and R MCA stenosis addressed Consults called: Informed Dr.  Khaliqdina of pts arrival, plan to MRI with ativan, etc. Admission status: Admit to inpatient  Severity of Illness: The appropriate patient status for this patient is INPATIENT. Inpatient status is judged to be reasonable and necessary in order to provide the required intensity of service to ensure the patient's safety. The patient's presenting symptoms, physical exam findings, and initial radiographic and laboratory data in the context of their chronic comorbidities is felt to place them at high risk for further clinical deterioration. Furthermore, it is not anticipated that the patient will be medically stable for discharge from the hospital within 2 midnights of  admission. The following factors support the patient status of inpatient.   IP Status for acute ischemic stroke with R MCA stenosis demonstrated on CT scan.   * I certify that at the point of admission it is my clinical judgment that the patient will require inpatient hospital care spanning beyond 2 midnights from the point of admission due to high intensity of service, high risk for further deterioration and high frequency of surveillance required.*    Caitlain Tweed M. DO [facility]  How to contact the [facility] Attending or Consulting provider 7A - 7P or covering provider during after hours 7P -7A, for this patient?  1. Check the care team in [facility] and look for a) attending/consulting [facility] provider listed and b) the [facility] team listed 2. Log into [email or web address]  Amion Physician Scheduling and messaging for groups and whole hospitals  On call and physician scheduling software for group practices, residents, hospitalists and other medical providers for call, clinic, rotation and shift schedules. On[employer] is a hospital-wide system for scheduling doctors and paging doctors on call. EasyPlot is for scientific plotting and data analysis.  [email or web address]  and use 's universal password to access. If you do not have the password, please contact the hospital operator.  3. Locate the [facility] provider you are looking for under [facility] and page to a number that you can be directly reached. 4. If you still have difficulty reaching the provider, please page the [facility] (Director on Call) for the Hospitalists listed on amion for assistance.  07/25/2020, 11:00 PM

## 2020-07-25 NOTE — ED Provider Notes (Signed)
[facility] EMERGENCY DEPARTMENT Provider Note   CSN: [id number] Arrival date & time: 07/25/20  1356     History Chief Complaint  Patient presents with  . Numbness    Larry GoodnightJeffrey Vega is a 59 y.o. male.  59 yo male with history of HTN presents with complaint of left side facial weakness and numbness. Patient states at 11am yesterday he was at work and felt a pop in his left ear followed by a numbness to his left cheek with left mouth weakness (unable to drink from left side of mouth) with twitching of his left eye. Patient denies extremity weakness or numbness, visual disturbance, any other complaints or concerns.         Past Medical History:  Diagnosis Date  . Hypertension     Patient Active Problem List   Diagnosis Date Noted  . Neurological deficit present 07/25/2020    Past Surgical History:  Procedure Laterality Date  . KNEE SURGERY         No family history on file.  Social History   Tobacco Use  . Smoking status: Light Tobacco Smoker  . Smokeless tobacco: Never Used  Substance Use Topics  . Alcohol use: Yes    Comment: occasionally  . Drug use: No    Home Medications Prior to Admission medications   Medication Sig Start Date End Date Taking? Authorizing Provider  azithromycin (ZITHROMAX) 250 MG tablet Take 1 tablet (250 mg total) by mouth daily. 11/02/13   Larry Vega, [place], MD    Allergies    Penicillins and Sulfa antibiotics  Review of Systems   Review of Systems  Constitutional: Negative for fever.  HENT: Negative for congestion.   Eyes: Negative for pain and visual disturbance.  Respiratory: Negative for shortness of breath.   Cardiovascular: Negative for chest pain.  Gastrointestinal: Negative for abdominal pain, nausea and vomiting.  Musculoskeletal: Negative for arthralgias and myalgias.  Skin: Negative for rash.  Allergic/Immunologic: Negative for immunocompromised state.  Neurological: Positive for facial asymmetry and  numbness. Negative for dizziness, speech difficulty, weakness and headaches.  All other systems reviewed and are negative.   Physical Exam Updated Vital Signs BP 138/84   Pulse 67   Temp 98.3 F (36.8 C) (Oral)   Resp (!) 21   Ht 5\' 9"  (1.753 m)   Wt 102.6 kg   SpO2 98%   BMI 33.40 kg/m   Physical Exam Vitals and nursing note reviewed.  Constitutional:      General: He is not in acute distress.    Appearance: He is well-developed. He is not diaphoretic.  HENT:     Head: Normocephalic and atraumatic.     Nose: Nose normal.     Mouth/Throat:     Mouth: Mucous membranes are moist.     Pharynx: No oropharyngeal exudate or posterior oropharyngeal erythema.  Eyes:     Extraocular Movements: Extraocular movements intact.     Pupils: Pupils are equal, round, and reactive to light.  Cardiovascular:     Rate and Rhythm: Normal rate and regular rhythm.     Heart sounds: Normal heart sounds.  Pulmonary:     Effort: Pulmonary effort is normal.     Breath sounds: Normal breath sounds.  Musculoskeletal:     Cervical back: Neck supple.     Right lower leg: No edema.     Left lower leg: No edema.  Skin:    General: Skin is warm and dry.     Findings: No  bruising, erythema or rash.  Neurological:     Mental Status: He is alert and oriented to person, place, and time.     Cranial Nerves: Cranial nerve deficit present.     Sensory: Sensory deficit present.     Coordination: Finger-Nose-Finger Test and Heel to [place] Test normal. Rapid alternating movements normal.     Comments: Difficulty with holding air in left cheek, reports left cheek numbness, eyebrow raise symmetric with sensation intact to forehead. Tongue midline. Frequent left eye twitching. No left eye watering.   Psychiatric:        Behavior: Behavior normal.     ED Results / Procedures / Treatments   Labs (all labs ordered are listed, but only abnormal results are displayed) Labs Reviewed  CBC - Abnormal; Notable for  the following components:      Result Value   RBC 4.15 (*)    All other components within normal limits  COMPREHENSIVE METABOLIC PANEL - Abnormal; Notable for the following components:   Glucose, Bld 112 (*)    All other components within normal limits  RESPIRATORY PANEL BY RT PCR (FLU A&B, COVID)  ETHANOL  PROTIME-INR  APTT  DIFFERENTIAL  RAPID URINE DRUG SCREEN, HOSP PERFORMED  URINALYSIS, ROUTINE W REFLEX MICROSCOPIC    EKG None  Radiology CT Angio Head W or Wo Contrast  Result Date: 07/25/2020 CLINICAL DATA:  Left facial twitching and numbness. EXAM: CT ANGIOGRAPHY HEAD AND NECK TECHNIQUE: Multidetector CT imaging of the head and neck was performed using the standard protocol during bolus administration of intravenous contrast. Multiplanar CT image reconstructions and MIPs were obtained to evaluate the vascular anatomy. Carotid stenosis measurements (when applicable) are obtained utilizing NASCET criteria, using the distal internal carotid diameter as the denominator. CONTRAST:  OMNIPAQUE IOHEXOL 350 MG/ML SOLN COMPARISON:  None. FINDINGS: CTA NECK FINDINGS Aortic arch: Standard 3 vessel aortic arch with widely patent arch vessel origins. Right carotid system: Patent with mild calcified and soft plaque at the carotid bifurcation. No evidence of significant stenosis or dissection. Left carotid system: Patent with mild calcified and soft plaque at the carotid bifurcation. No evidence of significant stenosis or dissection. Vertebral arteries: Patent and codominant. Suboptimal assessment of the vertebral artery origins bilaterally due to shoulder artifact. No evidence of significant stenosis or dissection more distally. Skeleton: Mild cervical spondylosis and facet arthrosis. Other neck: No evidence of cervical lymphadenopathy or mass. Upper chest: Clear lung apices. Review of the MIP images confirms the above findings CTA HEAD FINDINGS Anterior circulation: The internal carotid arteries  are patent from skull base to carotid termini with minimal nonstenotic plaque. ACAs and MCAs are patent without evidence of a proximal branch occlusion or significant A1 or M1 stenosis. There is a moderate to severe stenosis of the right M2 superior division proximally. No aneurysm is identified. Posterior circulation: The intracranial vertebral arteries are widely patent to the basilar. Patent PICA and SCA origins are identified bilaterally. The basilar artery is widely patent. There is a fetal origin of the right PCA. Both PCAs are patent without evidence of a significant proximal stenosis. No aneurysm is identified. Venous sinuses: Patent. Anatomic variants: Fetal right PCA. Review of the MIP images confirms the above findings IMPRESSION: 1. No large vessel occlusion. 2. 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 3. Mild cervical carotid artery atherosclerosis without stenosis. Electronically Signed   By: Sebastian Ache M.D.   On: 07/25/2020 18:55   CT HEAD WO CONTRAST  Result Date: 07/25/2020 CLINICAL  DATA:  LEFT-sided facial numbness since yesterday EXAM: CT HEAD WITHOUT CONTRAST TECHNIQUE: Contiguous axial images were obtained from the base of the skull through the vertex without intravenous contrast. COMPARISON:  None. FINDINGS: Brain: No evidence of acute infarction, hemorrhage, hydrocephalus, extra-axial collection or mass lesion/mass effect. Vascular: No hyperdense vessel or unexpected calcification. Skull: Normal. Negative for fracture or focal lesion. Sinuses/Orbits: No acute finding. Other: None. IMPRESSION: No acute intracranial abnormality. Electronically Signed   By: Meda Klinefelter MD   On: 07/25/2020 16:42   CT Angio Neck W and/or Wo Contrast  Result Date: 07/25/2020 CLINICAL DATA:  Left facial twitching and numbness. EXAM: CT ANGIOGRAPHY HEAD AND NECK TECHNIQUE: Multidetector CT imaging of the head and neck was performed using the standard protocol during bolus administration of  intravenous contrast. Multiplanar CT image reconstructions and MIPs were obtained to evaluate the vascular anatomy. Carotid stenosis measurements (when applicable) are obtained utilizing NASCET criteria, using the distal internal carotid diameter as the denominator. CONTRAST:  OMNIPAQUE IOHEXOL 350 MG/ML SOLN COMPARISON:  None. FINDINGS: CTA NECK FINDINGS Aortic arch: Standard 3 vessel aortic arch with widely patent arch vessel origins. Right carotid system: Patent with mild calcified and soft plaque at the carotid bifurcation. No evidence of significant stenosis or dissection. Left carotid system: Patent with mild calcified and soft plaque at the carotid bifurcation. No evidence of significant stenosis or dissection. Vertebral arteries: Patent and codominant. Suboptimal assessment of the vertebral artery origins bilaterally due to shoulder artifact. No evidence of significant stenosis or dissection more distally. Skeleton: Mild cervical spondylosis and facet arthrosis. Other neck: No evidence of cervical lymphadenopathy or mass. Upper chest: Clear lung apices. Review of the MIP images confirms the above findings CTA HEAD FINDINGS Anterior circulation: The internal carotid arteries are patent from skull base to carotid termini with minimal nonstenotic plaque. ACAs and MCAs are patent without evidence of a proximal branch occlusion or significant A1 or M1 stenosis. There is a moderate to severe stenosis of the right M2 superior division proximally. No aneurysm is identified. Posterior circulation: The intracranial vertebral arteries are widely patent to the basilar. Patent PICA and SCA origins are identified bilaterally. The basilar artery is widely patent. There is a fetal origin of the right PCA. Both PCAs are patent without evidence of a significant proximal stenosis. No aneurysm is identified. Venous sinuses: Patent. Anatomic variants: Fetal right PCA. Review of the MIP images confirms the above findings  IMPRESSION: 1. No large vessel occlusion. 2. 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 3. Mild cervical carotid artery atherosclerosis without stenosis. Electronically Signed   By: Sebastian Ache M.D.   On: 07/25/2020 18:55    Procedures Procedures (including critical care time)  Medications Ordered in ED Medications  aspirin chewable tablet 324 mg (has no administration in time range)    Or  aspirin suppository 300 mg (has no administration in time range)  clopidogrel (PLAVIX) tablet 75 mg (has no administration in time range)  sodium chloride 0.9 % bolus 1,000 mL (has no administration in time range)  0.9 %  sodium chloride infusion (has no administration in time range)  iohexol (OMNIPAQUE) 350 MG/ML injection 100 mL (100 mLs Intravenous Contrast Given 07/25/20 1824)    ED Course  I have reviewed the triage vital signs and the nursing notes.  Pertinent labs & imaging results that were available during my care of the patient were reviewed by me and considered in my medical decision making (see chart for details).  Clinical Course as of Jul 26 1947  Wynelle Link Jul 25, 2020  [id number] 59 year old male with complaint of a pop sensation in his left ear around 11 AM yesterday followed by numbness to the left cheek and mandible with weakness of the left side of his mouth and twitching of the left eye.  Exam shows left side mouth weakness, frequent left eye twitching and numbness to left cheek and left mandible.  Symmetric forehead and equal rise of eyebrows.  No extremity weakness or numbness. CT head is unremarkable, labs without significant changes include CBC, CMP, PTT, PT, alcohol.  Patient's blood pressure is mildly elevated on arrival at 164/77, currently 148/77.  Case discussed with Dr. Pilar Plate, ER attending who has seen the patient and recommends discussion with neurology for further guidance. Case discussed with Dr. Thomasena Edis, neuro hospitalist at [facility].  Recommends follow-up with outpatient  neurology, may transfer to [facility] for MRI if further work-up is desired now.  Discussed consideration of CTA head and neck.  If patient goes home today, may have 305 mg of aspirin now followed by 81 mg aspirin daily. Discussed with Dr. Pilar Plate, plan is for CTA head and neck at this time.  Discussed plan of care with patient and his wife, patient is agreeable to stay for CTA head and neck, does not desire MRI at [facility] at this time.   [LM]  1940 CTA returns with moderate to severe stenosis of right M2 MCA.  Discussed with Dr. Derry Lory, neuro hospitalist. Requests admission to hospitalist service. Orders placed for fluids, plavix, ASA, HOB to 30 degrees, permissive HTN to 200 systolic, bedrest.  Discussed results and plan of care with patient and wife, patient is agreeable to stay for admission. Discussed with Dr. Loney Loh with [facility] who will consult for admission.    [LM]    Clinical Course User Index [LM] Alden Hipp   MDM Rules/Calculators/A&P                          Final Clinical Impression(s) / ED Diagnoses Final diagnoses:  Middle cerebral artery stenosis, right  Paresthesia  Facial weakness    Rx / DC Orders ED Discharge Orders    None       Jeannie Fend, PA-C 07/25/20 1948    Sabas Sous, MD 07/25/20 2026

## 2020-07-25 NOTE — Progress Notes (Signed)
Brief Neuro Update:  Larry Vega is a 59 y.o. with hx of HTN, smoker, who presented with L cheek numbness and L eye twitching, has to drink on the R side of the mouth, no other deficit. He came over for further workup.  CT Head with no acute abnormality, CT angio with Right M2 significant stenosis.  Suspect that could explain his symptoms and that he may be pressure dependet.  I spoke to the ED team and made following recs:  - I ordered aspirin 324 + plavix 75mg  daily and give now. - I ordered fluid bolus and maintenace fluids - Discussed permissive HTN with goal SBP < 220 - Recommend stroke workup when he gets here. - Admission to hospitalist service - Head of bed flat, strict bed rest, - Hospitalist team to let me know when the patient is here.   [facility] Pager Number Erick Blinks

## 2020-07-25 NOTE — ED Triage Notes (Addendum)
L side facial numbness since yesterday at 11am. Denies weakness in arms/legs. Endorses headache.

## 2020-07-25 NOTE — ED Notes (Signed)
Pt sts LT cheek feels somewhat numb; has improved significantly since yesterday; sts water feels less cold on the LT side of his mouth.

## 2020-07-25 NOTE — Progress Notes (Signed)
Pt has arrived via [facility]. Pt is alert and oriented x4. Does not want to take his clothes off but stated he has no skin issues. Tele is in place. Oriented to room with bedrest measures in place. Bed alarm on and call bell in reach. Paged [facility] admissions to let them know pt has arrived.   2242: MD at bedside.

## 2020-07-26 ENCOUNTER — Inpatient Hospital Stay (HOSPITAL_COMMUNITY): Payer: Managed Care, Other (non HMO)

## 2020-07-26 DIAGNOSIS — G510 Bell's palsy: Secondary | ICD-10-CM

## 2020-07-26 NOTE — Discharge Summary (Addendum)
Physician Discharge Summary  Larry Vega NOB:[id number] DOB: 04/23/1961 DOA: 07/25/2020  PCP: Sigmund Hazel, MD  Admit date: 07/25/2020 Discharge date: 07/26/2020  Time spent: 20 minutes  Recommendations for Outpatient Follow-up:  1. Follow up neurology outpt as recd by Dr. Derry Lory 2. Follow up ENT outpatient as recd by Dr. Derry Lory   Discharge Diagnoses:  Principal Problem:   Bell's palsy Active Problems:   HTN (hypertension)   Middle cerebral artery stenosis, right  1. Barotrauma of facial nerve - 1. Initially concerned for R MCA territory stroke given the L facial weakness and R MCA M2 stenosis on CT scan; after evaluation by neurology they now think that patients presentation is more consistent with barotrauma to facial nerve. 2. Neurology recommends no further stroke work up at this time and actually cleared patient for discharge. 3. Can discharge patient tonight, or in AM (which ever he would prefer). 2. HTN - 1. Resume home BP meds 3. R MCA stenosis - 1. No further work up at this time per neuro recs 2. Follow up with neurology as outpt.  Discharge Condition: Good  Diet recommendation: Regular  Filed Weights   07/25/20 1402  Weight: 102.6 kg    History of present illness:  Larry Vega is a 59 y.o. male with medical history significant of HTN, smoking.  Pt presents to the ED at [facility] with c/o L cheek numbness, L face weakness, L eye twitching.  Symptoms onset 11am yesterday.  Felt a "pop" in his left ear.  Symptoms are persistent.  No weakness or numbness to any extremities, no vision changes no speech changes.  Does have headache.   Hospital Course:  CTA of head and neck was concerning for R MCA M2 stenosis.  Pt was transferred from [facility] to [facility] as an admission.  Neurology evaluated patient at bedside; however, after their evaluation they feel that patient just has barotrauma to his L facial nerve and has L facial nerve injury / palsy as a  result.  Dr. Derry Lory recommends no further neurologic work up inpatient is needed.  Procedures:  CT head  CTA head and neck  Consultations:  Dr. Melina Fiddler  Discharge Exam: Vitals:   07/25/20 2105 07/25/20 2217  BP: (!) 163/83 (!) 168/98  Pulse: 62 65  Resp: (!) 22 (!) 25  Temp:  97.6 F (36.4 C)  SpO2: 98% 99%    General: NAD, calm, comfortable Cardiovascular: RRR, no MRG Respiratory: CTA B  Discharge Instructions Resume home BP meds Referral's to neurology and ENT.   Allergies as of 07/26/2020      Reactions   Penicillins    Unknown = severe    Sulfa Antibiotics    Anaphylaxis      Medication List    You have not been prescribed any medications.    Allergies  Allergen Reactions  . Penicillins     Unknown = severe   . Sulfa Antibiotics     Anaphylaxis       The results of significant diagnostics from this hospitalization (including imaging, microbiology, ancillary and laboratory) are listed below for reference.    Significant Diagnostic Studies: CT Angio Head W or Wo Contrast  Result Date: 07/25/2020 CLINICAL DATA:  Left facial twitching and numbness. EXAM: CT ANGIOGRAPHY HEAD AND NECK TECHNIQUE: Multidetector CT imaging of the head and neck was performed using the standard protocol during bolus administration of intravenous contrast. Multiplanar CT image reconstructions and MIPs were obtained to evaluate the vascular anatomy. Carotid stenosis measurements (when  applicable) are obtained utilizing NASCET criteria, using the distal internal carotid diameter as the denominator. CONTRAST:  100mL OMNIPAQUE IOHEXOL 350 MG/ML SOLN COMPARISON:  None. FINDINGS: CTA NECK FINDINGS Aortic arch: Standard 3 vessel aortic arch with widely patent arch vessel origins. Right carotid system: Patent with mild calcified and soft plaque at the carotid bifurcation. No evidence of significant stenosis or dissection. Left carotid system: Patent with mild calcified and soft  plaque at the carotid bifurcation. No evidence of significant stenosis or dissection. Vertebral arteries: Patent and codominant. Suboptimal assessment of the vertebral artery origins bilaterally due to shoulder artifact. No evidence of significant stenosis or dissection more distally. Skeleton: Mild cervical spondylosis and facet arthrosis. Other neck: No evidence of cervical lymphadenopathy or mass. Upper chest: Clear lung apices. Review of the MIP images confirms the above findings CTA HEAD FINDINGS Anterior circulation: The internal carotid arteries are patent from skull base to carotid termini with minimal nonstenotic plaque. ACAs and MCAs are patent without evidence of a proximal branch occlusion or significant A1 or M1 stenosis. There is a moderate to severe stenosis of the right M2 superior division proximally. No aneurysm is identified. Posterior circulation: The intracranial vertebral arteries are widely patent to the basilar. Patent PICA and SCA origins are identified bilaterally. The basilar artery is widely patent. There is a fetal origin of the right PCA. Both PCAs are patent without evidence of a significant proximal stenosis. No aneurysm is identified. Venous sinuses: Patent. Anatomic variants: Fetal right PCA. Review of the MIP images confirms the above findings IMPRESSION: 1. No large vessel occlusion. 2. 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 3. Mild cervical carotid artery atherosclerosis without stenosis. Electronically Signed   By: Sebastian AcheAllen  Grady M.D.   On: 07/25/2020 18:55   CT HEAD WO CONTRAST  Result Date: 07/25/2020 CLINICAL DATA:  LEFT-sided facial numbness since yesterday EXAM: CT HEAD WITHOUT CONTRAST TECHNIQUE: Contiguous axial images were obtained from the base of the skull through the vertex without intravenous contrast. COMPARISON:  None. FINDINGS: Brain: No evidence of acute infarction, hemorrhage, hydrocephalus, extra-axial collection or mass lesion/mass effect. Vascular:  No hyperdense vessel or unexpected calcification. Skull: Normal. Negative for fracture or focal lesion. Sinuses/Orbits: No acute finding. Other: None. IMPRESSION: No acute intracranial abnormality. Electronically Signed   By: Meda KlinefelterStephanie  Peacock MD   On: 07/25/2020 16:42   CT Angio Neck W and/or Wo Contrast  Result Date: 07/25/2020 CLINICAL DATA:  Left facial twitching and numbness. EXAM: CT ANGIOGRAPHY HEAD AND NECK TECHNIQUE: Multidetector CT imaging of the head and neck was performed using the standard protocol during bolus administration of intravenous contrast. Multiplanar CT image reconstructions and MIPs were obtained to evaluate the vascular anatomy. Carotid stenosis measurements (when applicable) are obtained utilizing NASCET criteria, using the distal internal carotid diameter as the denominator. CONTRAST:  100mL OMNIPAQUE IOHEXOL 350 MG/ML SOLN COMPARISON:  None. FINDINGS: CTA NECK FINDINGS Aortic arch: Standard 3 vessel aortic arch with widely patent arch vessel origins. Right carotid system: Patent with mild calcified and soft plaque at the carotid bifurcation. No evidence of significant stenosis or dissection. Left carotid system: Patent with mild calcified and soft plaque at the carotid bifurcation. No evidence of significant stenosis or dissection. Vertebral arteries: Patent and codominant. Suboptimal assessment of the vertebral artery origins bilaterally due to shoulder artifact. No evidence of significant stenosis or dissection more distally. Skeleton: Mild cervical spondylosis and facet arthrosis. Other neck: No evidence of cervical lymphadenopathy or mass. Upper chest: Clear lung apices. Review  of the MIP images confirms the above findings CTA HEAD FINDINGS Anterior circulation: The internal carotid arteries are patent from skull base to carotid termini with minimal nonstenotic plaque. ACAs and MCAs are patent without evidence of a proximal branch occlusion or significant A1 or M1 stenosis.  There is a moderate to severe stenosis of the right M2 superior division proximally. No aneurysm is identified. Posterior circulation: The intracranial vertebral arteries are widely patent to the basilar. Patent PICA and SCA origins are identified bilaterally. The basilar artery is widely patent. There is a fetal origin of the right PCA. Both PCAs are patent without evidence of a significant proximal stenosis. No aneurysm is identified. Venous sinuses: Patent. Anatomic variants: Fetal right PCA. Review of the MIP images confirms the above findings IMPRESSION: 1. No large vessel occlusion. 2. 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 3. Mild cervical carotid artery atherosclerosis without stenosis. Electronically Signed   By: Sebastian Ache M.D.   On: 07/25/2020 18:55   DG CHEST PORT 1 VIEW  Result Date: 07/25/2020 CLINICAL DATA:  Stroke, numbness EXAM: PORTABLE CHEST 1 VIEW COMPARISON:  None. FINDINGS: The heart size and mediastinal contours are within normal limits. Both lungs are clear. The visualized skeletal structures are unremarkable. IMPRESSION: No active disease. Electronically Signed   By: Sharlet Salina M.D.   On: 07/25/2020 23:24    Microbiology: Recent Results (from the past 240 hour(s))  Respiratory Panel by RT PCR (Flu A&B, Covid) - Nasopharyngeal Swab     Status: None   Collection Time: 07/25/20  8:02 PM   Specimen: Nasopharyngeal Swab  Result Value Ref Range Status   SARS Coronavirus 2 by RT PCR NEGATIVE NEGATIVE Final    Comment: (NOTE) SARS-CoV-2 target nucleic acids are NOT DETECTED.  The SARS-CoV-2 RNA is generally detectable in upper respiratoy specimens during the acute phase of infection. The lowest concentration of SARS-CoV-2 viral copies this assay can detect is 131 copies/mL. A negative result does not preclude SARS-Cov-2 infection and should not be used as the sole basis for treatment or other patient management decisions. A negative result may occur with   improper specimen collection/handling, submission of specimen other than nasopharyngeal swab, presence of viral mutation(s) within the areas targeted by this assay, and inadequate number of viral copies (<131 copies/mL). A negative result must be combined with clinical observations, patient history, and epidemiological information. The expected result is Negative.  Fact Sheet for Patients:  https://www.moore.com/  Fact Sheet for Healthcare Providers:  https://www.young.biz/  This test is no t yet approved or cleared by the [place] FDA and  has been authorized for detection and/or diagnosis of SARS-CoV-2 by FDA under an Emergency Use Authorization (EUA). This EUA will remain  in effect (meaning this test can be used) for the duration of the COVID-19 declaration under Section 564(b)(1) of the Act, 21 U.S.C. section 360bbb-3(b)(1), unless the authorization is terminated or revoked sooner.     Influenza A by PCR NEGATIVE NEGATIVE Final   Influenza B by PCR NEGATIVE NEGATIVE Final    Comment: (NOTE) The Xpert Xpress SARS-CoV-2/FLU/RSV assay is intended as an aid in  the diagnosis of influenza from Nasopharyngeal swab specimens and  should not be used as a sole basis for treatment. Nasal washings and  aspirates are unacceptable for Xpert Xpress SARS-CoV-2/FLU/RSV  testing.  Fact Sheet for Patients: https://www.moore.com/  Fact Sheet for Healthcare Providers: https://www.young.biz/  This test is not yet approved or cleared by the [place] and  has been authorized for detection and/or diagnosis of SARS-CoV-2 by  FDA under an Emergency Use Authorization (EUA). This EUA will remain  in effect (meaning this test can be used) for the duration of the  Covid-19 declaration under Section 564(b)(1) of the Act, 21  U.S.C. section 360bbb-3(b)(1), unless the authorization is  terminated or  revoked. Performed at [facility], [address] [place], [place] [address]      Labs: Basic Metabolic Panel: Recent Labs  Lab 07/25/20 1459  NA 139  K 3.9  CL 104  CO2 26  GLUCOSE 112*  BUN 18  CREATININE 1.12  CALCIUM 9.0   Liver Function Tests: Recent Labs  Lab 07/25/20 1459  AST 18  ALT 17  ALKPHOS 47  BILITOT 0.4  PROT 7.3  ALBUMIN 4.2   No results for input(s): LIPASE, AMYLASE in the last 168 hours. No results for input(s): AMMONIA in the last 168 hours. CBC: Recent Labs  Lab 07/25/20 1459  WBC 6.8  NEUTROABS 4.1  HGB 13.7  HCT 40.8  MCV 98.3  PLT 199   Cardiac Enzymes: No results for input(s): CKTOTAL, CKMB, CKMBINDEX, TROPONINI in the last 168 hours. BNP: BNP (last 3 results) No results for input(s): BNP in the last 8760 hours.  ProBNP (last 3 results) No results for input(s): PROBNP in the last 8760 hours.  CBG: No results for input(s): GLUCAP in the last 168 hours.     Signed:  Hillary Bow DO.  [facility] 07/26/2020, 1:15 AM

## 2020-07-26 NOTE — Progress Notes (Signed)
Attempted MRI per RN pt cant come to MRI yet, not sure if provider still needs MRI.

## 2020-07-26 NOTE — Progress Notes (Signed)
Pt left with wife and AVS given to patient and reviewed. IV taken out.

## 2020-07-26 NOTE — Plan of Care (Signed)
Pt alert and oriented. Pt is on RA. Q2hr Neuro's in place.  Problem: Education: Goal: Knowledge of disease or condition will improve Outcome: Progressing Goal: Knowledge of secondary prevention will improve Outcome: Progressing Goal: Knowledge of patient specific risk factors addressed and post discharge goals established will improve Outcome: Progressing Goal: Individualized Educational Video(s) Outcome: Progressing   Problem: Coping: Goal: Will verbalize positive feelings about self Outcome: Progressing   Problem: Self-Care: Goal: Ability to participate in self-care as condition permits will improve Outcome: Progressing Goal: Verbalization of feelings and concerns over difficulty with self-care will improve Outcome: Progressing   Problem: Nutrition: Goal: Risk of aspiration will decrease Outcome: Progressing   Problem: Ischemic Stroke/TIA Tissue Perfusion: Goal: Complications of ischemic stroke/TIA will be minimized Outcome: Progressing

## 2020-07-26 NOTE — Consult Note (Signed)
NEUROLOGY CONSULTATION NOTE   Date of service: July 25, 2020 Patient Name: Larry Vega MRN:  [id number] DOB:  [birth year] Reason for consult: "L sided numbness"  History of Present Illness  Larry Vega is a 59 y.o. male with PMH significant for Hypertension who presents with  L facial numbness and having to drink through the R side of the mouth and subjective left eye twitch. He was seen in the ED at [facility] and workup with [facility] and CTA demosntrated moderate to severe narrowing of the R MCA M2. He was given Aspirin and Plavix in the ED.  Suspicion for potential symptomatic stenosis vs stroke and therefore he was transferred to [facility] for an MRI Brain and further workup.  Patient reports that he was working at [employer] at his job on Saturday (07/24/20) when he had a sneeze followed by a pop in his left ear. This was immediately followed by less than 3 seconds of vertigo and then numbness and tingling in the elft side of his face which resolved. He noticed that when he would drink coffee, it was hot on the right  In his mouth but not on the left. I made him eat chips and taste some sugar with the anterior tongue and he feels that it tastes much more bland on the left side of his tongue. He also endorses having some mild twitching of the left eye brow since but that has improved.  He does endorse that this time of the year, it is not uncommon for his nose to be stuffed up specially with his known deviated nasal septum. He does endorse that it feels like his nose has been stuffed up for the last few days and has been somewhat difficult to clear up his sinuses.  He denies any arm or leg weakness, no numbness, no dysarthria, no aphasia, no vision deficit, no tinnitis, no vertigo, no problems with hearing, no tenderness to palpation of the mastoid process, no tenderness to palpation over the TMJ on the left or right.   ROS   Constitutional Denies weight loss, fever and chills.   HEENT Denies changes in vision and hearing.  Respiratory Denies SOB and cough.  CV Denies palpitations and CP  GI Denies abdominal pain, nausea, vomiting and diarrhea.  GU Denies dysuria and urinary frequency.  MSK Denies myalgia and joint pain.  Skin Denies rash and pruritus.  Neurological Denies headache and syncope.  Psychiatric Denies recent changes in mood. Denies anxiety and depression.   Past History   Past Medical History:  Diagnosis Date  . Hypertension    Past Surgical History:  Procedure Laterality Date  . KNEE SURGERY     Family History  Problem Relation Age of Onset  . Cancer Mother   . Hypertension Neg Hx   . Diabetes Neg Hx   . Hyperlipidemia Neg Hx   . Heart disease Neg Hx    Social History   Socioeconomic History  . Marital status: Married    Spouse name: Not on file  . Number of children: Not on file  . Years of education: Not on file  . Highest education level: Not on file  Occupational History  . Not on file  Tobacco Use  . Smoking status: Light Tobacco Smoker  . Smokeless tobacco: Never Used  Substance and Sexual Activity  . Alcohol use: Yes    Comment: occasionally  . Drug use: No  . Sexual activity: Not on file  Other Topics Concern  .  Not on file  Social History Narrative  . Not on file   Social Determinants of Health   Financial Resource Strain:   . Difficulty of Paying Living Expenses: Not on file  Food Insecurity:   . Worried About Programme researcher, broadcasting/film/videounning Out of Food in the Last Year: Not on file  . Ran Out of Food in the Last Year: Not on file  Transportation Needs:   . Lack of Transportation (Medical): Not on file  . Lack of Transportation (Non-Medical): Not on file  Physical Activity:   . Days of Exercise per Week: Not on file  . Minutes of Exercise per Session: Not on file  Stress:   . Feeling of Stress : Not on file  Social Connections:   . Frequency of Communication with Friends and Family: Not on file  . Frequency of Social  Gatherings with Friends and Family: Not on file  . Attends Religious Services: Not on file  . Active Member of Clubs or Organizations: Not on file  . Attends BankerClub or Organization Meetings: Not on file  . Marital Status: Not on file   Allergies  Allergen Reactions  . Penicillins     Unknown = severe   . Sulfa Antibiotics     Anaphylaxis     Medications   Medications Prior to Admission  Medication Sig Dispense Refill Last Dose  . azithromycin (ZITHROMAX) 250 MG tablet Take 1 tablet (250 mg total) by mouth daily. 4 tablet 0      Vitals   Vitals:   07/25/20 1700 07/25/20 1915 07/25/20 1957 07/25/20 2105  BP: (!) 148/78 138/84 (!) 161/81 (!) 163/83  Pulse: 73 67 65 62  Resp: (!) 25 (!) 21 15 (!) 22  Temp:      TempSrc:      SpO2: 95% 98% 99% 98%  Weight:      Height:         Body mass index is 33.4 kg/m.  Physical Exam   General: Laying comfortably in bed; in no acute distress. HENT: Normal oropharynx and mucosa. Normal external appearance of ears and nose. Neck: Supple, no pain or tenderness  CV: No JVD. No peripheral edema.  Pulmonary: Symmetric Chest rise. Normal respiratory effort.  Abdomen: Soft to touch, non-tender.  Ext: No cyanosis, edema, or deformity  Skin: No rash. Normal palpation of skin.   Musculoskeletal: Normal digits and nails by inspection. No clubbing.   Neurologic Examination  Mental status/Cognition: Alert, oriented to self, place, month and year, good attention.  Speech/language: Fluent, comprehension intact, object naming intact, repetition intact.  Cranial nerves:   CN II Pupils equal and reactive to light, no VF deficits    CN III,IV,VI EOM intact, no gaze preference or deviation, no nystagmus    CN V normal sensation in V1, V2, and V3 segments bilaterally    CN VII Spasm of the left upper and lower face along with decreased taste in left anterior tongue.   CN VIII normal hearing to speech   CN IX & X normal palatal elevation, no uvular  deviation   CN XI 5/5 head turn and 5/5 shoulder shrug bilaterally   CN XII midline tongue protrusion   Motor:  Muscle bulk: normal, tone normal, pronator drift normal tremor normal Mvmt Root Nerve  Muscle Right Left Comments  SA C5/6 Ax Deltoid 5 5   EF C5/6 Mc Biceps 5 5   EE C6/7/8 Rad Triceps 5 5   WF C6/7 Med FCR 5 5  WE C7/8 PIN ECU 5 5   F Ab C8/T1 U ADM/FDI 5 5   HF L1/2/3 Fem Illopsoas 5 5   KE L2/3/4 Fem Quad 5 5   DF L4/5 D Peron Tib Ant 5 5   PF S1/2 Tibial Grc/Sol 5 5    Reflexes:  Right Left Comments  Pectoralis      Biceps (C5/6) 1 1   Brachioradialis (C5/6) 1 1    Triceps (C6/7) 1 1    Patellar (L3/4) 1 1    Achilles (S1) 1 1    Hoffman      Plantar     Jaw jerk    Sensation:  Light touch Intact BL   Pin prick Intact BL   Temperature    Vibration   Proprioception    Coordination/Complex Motor:  - Finger to Nose intact BL - Heel to shin intact BL - Rapid alternating movement intact BL - Gait: Stride length normal. Arm swing normal. Base width normal  Labs   CBC:  Recent Labs  Lab 07/25/20 1459  WBC 6.8  NEUTROABS 4.1  HGB 13.7  HCT 40.8  MCV 98.3  PLT 199    Basic Metabolic Panel:  Lab Results  Component Value Date   NA 139 07/25/2020   K 3.9 07/25/2020   CO2 26 07/25/2020   GLUCOSE 112 (H) 07/25/2020   BUN 18 07/25/2020   CREATININE 1.12 07/25/2020   CALCIUM 9.0 07/25/2020   GFRNONAA >60 07/25/2020   Lipid Panel: No results found for: LDLCALC HgbA1c: No results found for: HGBA1C Urine Drug Screen: No results found for: LABOPIA, COCAINSCRNUR, LABBENZ, AMPHETMU, THCU, LABBARB  Alcohol Level     Component Value Date/Time   ETH <10 07/25/2020 1459     CTH without contrast: CTH was negative for a large hypodensity concerning for a large territory infarct or hyperdensity concerning for an ICH  CT Angio Head and Neck W and/or Wo Contrast  IMPRESSION: 1. No large vessel occlusion. 2. Moderate to severe proximal right M2  stenosis. 3. Mild cervical carotid artery atherosclerosis without stenosis.    Impression   [place] Chelf is a 59 y.o. male with PMH significant for Hypertension who presents with L facial spasm involving upper and lower face, decreased taste on left side of the tongue, facial numbness which resolved. Symptoms abruptly started after sneeze and pop in the left ear.  Initial concern was for a potential stroke which demonstrated a R MCA M2 stenosis. However, stroke is unlikely to cause positive symptoms such as spasm and also the involvement of the left upper face is unlikely to be explained by the MCA stenosis which typically only involves the lower face. I suspect that the abrupt onset of his symptoms (after a sneeze and a pop along with self reported sinusitis) is likely due to barotrauma of the facial nerve probably in the facial canal. This would also explain the involvement of the chordae tympani and alteration of taste on the left.  His symptoms have been improving and the numbness has resolved.  He is eager to go home and I think that is okay at this time. I discussed follow up with neurology in 1 week and to come to the ED if his symptoms are getting worse.The only thing imaging will help [place] rule out is a metastatic process. This is a consideration given his history of smoking. He would still like to proceed with outpatient workup.  Recommendations  - Follow up with neurology outpatient  in 1 week - will likely benefit from imaging outpatient - Follow up with ENT for concern for barotrauma induced facial nerve dysfunction.  ______________________________________________________________________   Thank you for the opportunity to take part in the care of this patient. If you have any further questions, please contact the neurology consultation attending.  Signed,  Erick Blinks [facility] Pager Number [phone]

## 2020-07-26 NOTE — Discharge Instructions (Signed)
Patient able to return to work on 11/22.

## 2020-07-26 NOTE — Progress Notes (Signed)
Pt called wife to come and pick him up.

## 2020-07-28 ENCOUNTER — Other Ambulatory Visit: Payer: Self-pay

## 2020-07-28 ENCOUNTER — Ambulatory Visit (INDEPENDENT_AMBULATORY_CARE_PROVIDER_SITE_OTHER): Payer: BC Managed Care – PPO | Admitting: Neurology

## 2020-07-28 ENCOUNTER — Encounter: Payer: Self-pay | Admitting: Neurology

## 2020-07-28 VITALS — BP 151/73 | HR 63 | Ht 69.0 in | Wt 224.0 lb

## 2020-07-28 DIAGNOSIS — G510 Bell's palsy: Secondary | ICD-10-CM

## 2020-07-28 DIAGNOSIS — I679 Cerebrovascular disease, unspecified: Secondary | ICD-10-CM | POA: Diagnosis not present

## 2020-07-28 MED ORDER — VALACYCLOVIR HCL 1 G PO TABS: 1000.0000 mg | ORAL_TABLET | Freq: Three times a day (TID) | ORAL | 0 refills | Status: AC

## 2020-07-28 MED ORDER — PREDNISONE 10 MG PO TABS: ORAL_TABLET | ORAL | 0 refills | Status: AC

## 2020-07-28 NOTE — Progress Notes (Signed)
Chief Complaint  Patient presents with  . New Patient (Initial Visit)    [facility] RM. with wife, Internal referral from Erick Blinks, MD for facial nerve spasm due to barotrauma.   Marland Kitchen PCP    Katheren Shams    HISTORICAL  Larry Vega is a 59 year old male, seen in request by emergency room physician Dr. Erick Blinks, and his primary care physician Dr. Sigmund Hazel for evaluation of right facial weakness, he is accompanied by his wife at today's clinical visit July 28, 2020.  I reviewed and summarized the referring past medical history Hypertension  On July 24, 2020, while at work, he sneezed hard, then felt instant pop of his left ear, transient dizziness, later that day, he also noticed left lower leg numbness, noticed frequent left eyelid muscle twitching, later noted difficulty closing his right eye, facial asymmetry.  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  ALLERGIES: Allergies  Allergen Reactions  . Penicillins     Unknown = severe   . Sulfa Antibiotics     Anaphylaxis     HOME MEDICATIONS: Current Outpatient Medications  Medication Sig Dispense Refill  .  hydrochlorothiazide (HYDRODIURIL) 12.5 MG tablet Take by mouth.    . losartan (COZAAR) 25 MG tablet Take 12.5 mg by mouth.      No current facility-administered medications for this visit.    PAST MEDICAL HISTORY: Past Medical History:  Diagnosis Date  . Hypertension     PAST SURGICAL HISTORY: Past Surgical History:  Procedure Laterality Date  . KNEE SURGERY      FAMILY HISTORY: Family History  Problem Relation Age of Onset  . Hypertension Neg Hx   . Diabetes Neg Hx   . Hyperlipidemia Neg Hx   . Heart disease Neg Hx     SOCIAL HISTORY: Social History   Socioeconomic History  . Marital status: Married    Spouse name: Not on file  . Number of children: Not on file  . Years of education: Not on file  . Highest education level: Not on file  Occupational History  . Not on file  Tobacco Use  . Smoking status: Former Games developer  . Smokeless tobacco: Never Used  Substance and Sexual Activity  . Alcohol use: Yes    Comment: occasionally-beer  . Drug use: No  . Sexual activity: Not on file  Other Topics Concern  . Not on file  Social History Narrative  . Not on file   Social Determinants of Health   Financial Resource Strain:   . Difficulty of Paying Living Expenses: Not on file  Food Insecurity:   . Worried About Programme researcher, broadcasting/film/video in the Last Year: Not on file  . Ran  Out of Food in the Last Year: Not on file  Transportation Needs:   . Lack of Transportation (Medical): Not on file  . Lack of Transportation (Non-Medical): Not on file  Physical Activity:   . Days of Exercise per Week: Not on file  . Minutes of Exercise per Session: Not on file  Stress:   . Feeling of Stress : Not on file  Social Connections:   . Frequency of Communication with Friends and Family: Not on file  . Frequency of Social Gatherings with Friends and Family: Not on file  . Attends Religious Services: Not on file  . Active Member of Clubs or Organizations: Not on file  . Attends Tax inspector Meetings: Not on file  . Marital Status: Not on file  Intimate Partner Violence:   . Fear of Current or Ex-Partner: Not on file  . Emotionally Abused: Not on file  . Physically Abused: Not on file  . Sexually Abused: Not on file     PHYSICAL EXAM   Vitals:   07/28/20 0724  BP: (!) 151/73  Pulse: 63  Weight: 224 lb (101.6 kg)  Height: 5\' 9"  (1.753 m)   Not recorded     Body mass index is 33.08 kg/m.  PHYSICAL EXAMNIATION:  Gen: NAD, conversant, well nourised, well groomed                     Cardiovascular: Regular rate rhythm, no peripheral edema, warm, nontender. Eyes: Conjunctivae clear without exudates or hemorrhage Neck: Supple, no carotid bruits. Pulmonary: Clear to auscultation bilaterally   NEUROLOGICAL EXAM:  MENTAL STATUS: Speech:    Speech is normal; fluent and spontaneous with normal comprehension.  Cognition:     Orientation to time, place and person     Normal recent and remote memory     Normal Attention span and concentration     Normal Language, naming, repeating,spontaneous speech     Fund of knowledge   CRANIAL NERVES: CN II: Visual fields are full to confrontation. Pupils are round equal and briskly reactive to light. CN III, IV, VI: extraocular movement are normal. No ptosis. CN V: Facial sensation is intact to light touch, bilateral corneal reflex were present CN VII: Mild right upper and lower face weakness, he can close his right eye but not tightly, decreased right frontalis, right cheek muscle weakness CN VIII: Hearing is normal to causal conversation. CN IX, X: Phonation is normal. CN XI: Head turning and shoulder shrug are intact  MOTOR: There is no pronator drift of out-stretched arms. Muscle bulk and tone are normal. Muscle strength is normal.  REFLEXES: Reflexes are 2+ and symmetric at the biceps, triceps, knees, and ankles. Plantar responses are flexor.  SENSORY: Intact to light touch, pinprick and vibratory  sensation are intact in fingers and toes.  COORDINATION: There is no trunk or limb dysmetria noted.  GAIT/STANCE: Posture is normal. Gait is steady with normal steps, base, arm swing, and turning. Heel and toe walking are normal. Tandem gait is normal.  Romberg is absent.   DIAGNOSTIC DATA (LABS, IMAGING, TESTING) - I reviewed patient records, labs, notes, testing and imaging myself where available.   ASSESSMENT AND PLAN  Larry Vega is a 59 y.o. male    Right Bell's palsy on July 24, 2020  The atypical feature is that was preceded by sneeze, he also complains of transient vertigo, left lower lip numbness  I have offered potential further evaluation such as MRI  of the brain with without contrast to rule out cranial base pathology, patient decided to hold off further evaluation at this point,  His right facial weakness has began to improve,  Will treat with valacyclovir 1000 mg 3 times a day, prednisone tapering dose  Return to clinic for new issues  Right proximal M2 stenosis  He has vascular risk factor of hypertension, obesity, aging,  I have suggested increase water intake, start baby aspirin daily  Is going to follow-up by his primary care physician for more laboratory evaluations    Levert Feinstein, M.D. Ph.D.  [facility] [address] [place], [place] [address] Ph: [phone] Fax: [phone]  CC:  Erick Blinks, MD [address] [place],  [place] [address]  Sigmund Hazel, MD

## 2020-07-28 NOTE — Patient Instructions (Signed)
Prednisone 10 mg tablets  6 tablets for the first day 5 tablets for the second day 4 tablets for 1 day 3 tablets for 1 day 2 tablets for 1 day 1 tablet  Then stop.

## 2020-07-29 DIAGNOSIS — I10 Essential (primary) hypertension: Secondary | ICD-10-CM | POA: Diagnosis not present

## 2021-09-25 DIAGNOSIS — Z13220 Encounter for screening for lipoid disorders: Secondary | ICD-10-CM | POA: Diagnosis not present

## 2021-09-25 DIAGNOSIS — Z136 Encounter for screening for cardiovascular disorders: Secondary | ICD-10-CM | POA: Diagnosis not present

## 2021-09-25 DIAGNOSIS — Z713 Dietary counseling and surveillance: Secondary | ICD-10-CM | POA: Diagnosis not present

## 2021-09-25 DIAGNOSIS — Z6833 Body mass index (BMI) 33.0-33.9, adult: Secondary | ICD-10-CM | POA: Diagnosis not present

## 2021-09-25 DIAGNOSIS — I10 Essential (primary) hypertension: Secondary | ICD-10-CM | POA: Diagnosis not present

## 2021-09-25 DIAGNOSIS — Z760 Encounter for issue of repeat prescription: Secondary | ICD-10-CM | POA: Diagnosis not present

## 2022-01-31 IMAGING — CT CT HEAD W/O CM
3 series · 16 of 47 positions shown, 19 images · non-contrast
Comparison: None.

CLINICAL DATA: LEFT-sided facial numbness since yesterday

EXAM:
CT HEAD WITHOUT CONTRAST
TECHNIQUE: Contiguous axial images were obtained from the base of the skull
through the vertex without intravenous contrast.

[Series 2: head wo · axial · 0.46mm/px · z∈[-144,-19]mm · 10 of 31 slices shown, 13 images]
[im 3/31  brain]
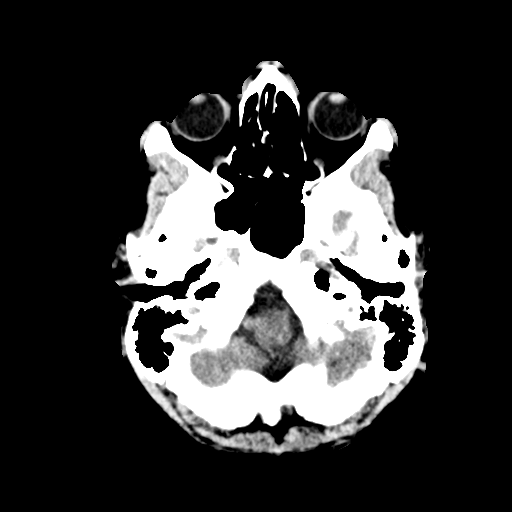
[im 3/31  bone]
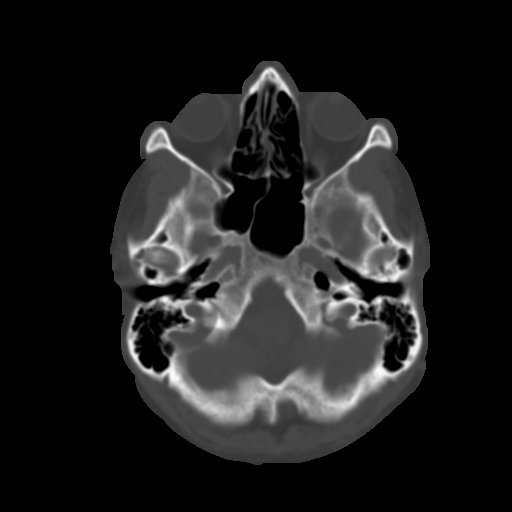
[im 6/31  brain]
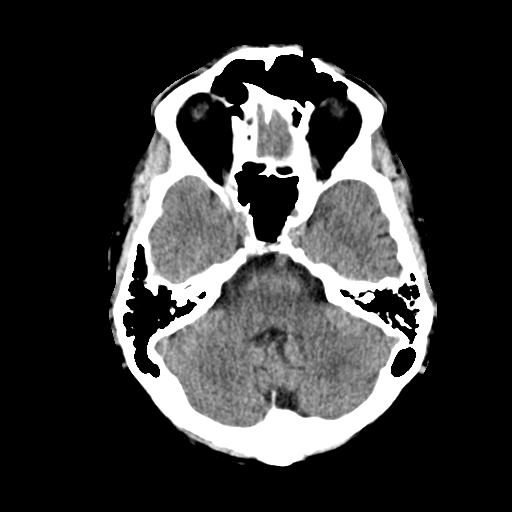
[im 9/31  brain]
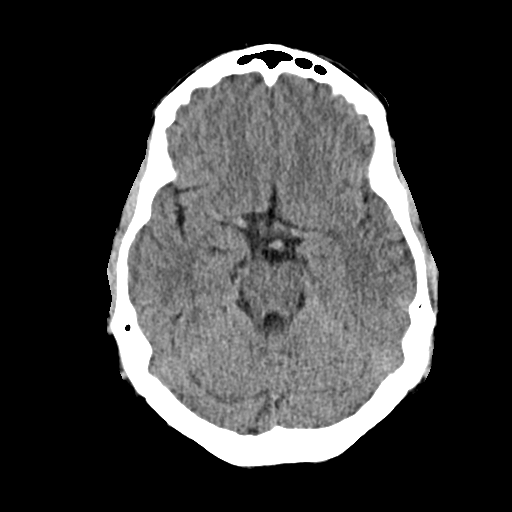
[im 11/31  brain]
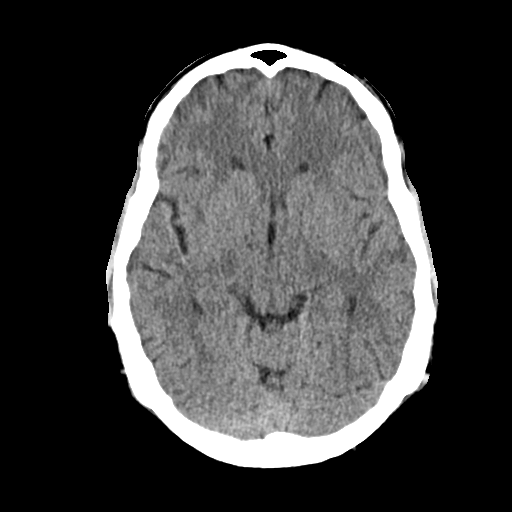
[im 14/31  brain]
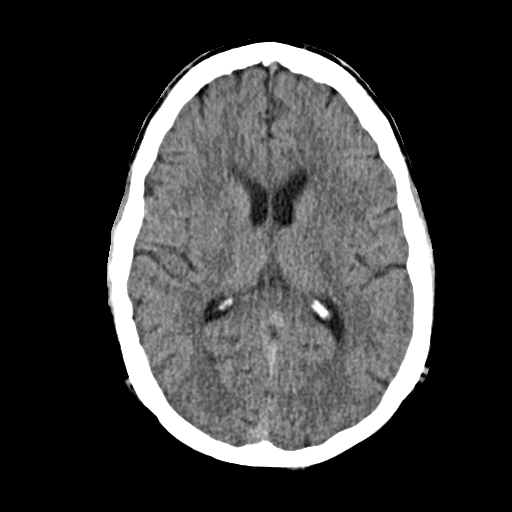
[im 14/31  bone]
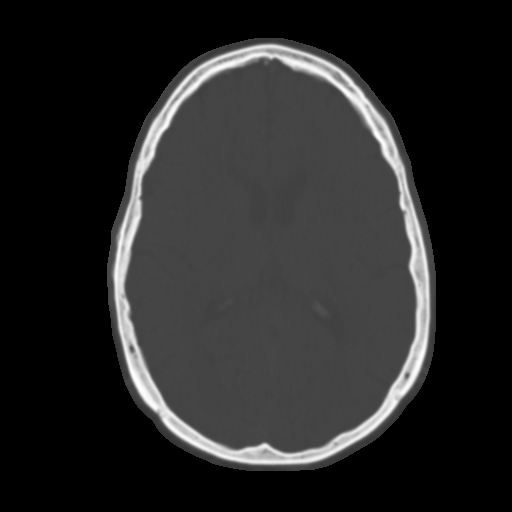
[im 17/31  brain]
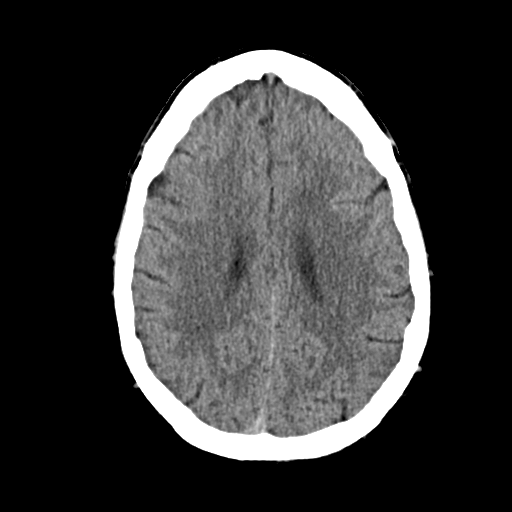
[im 20/31  brain]
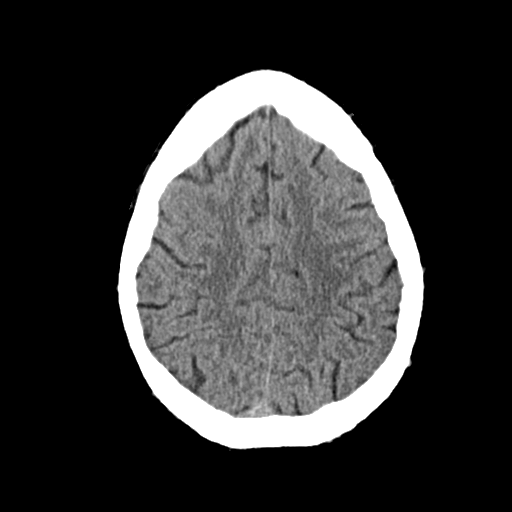
[im 23/31  brain]
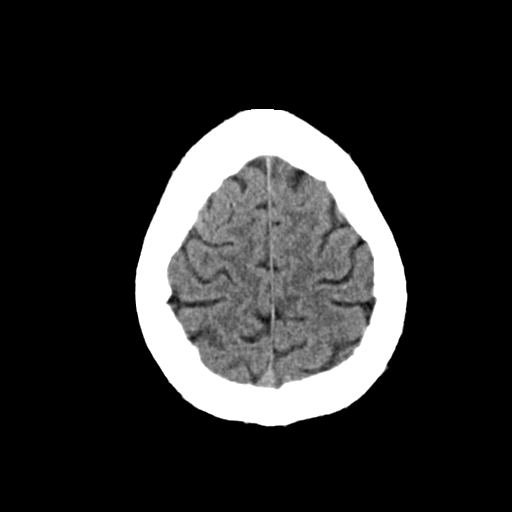
[im 25/31  brain]
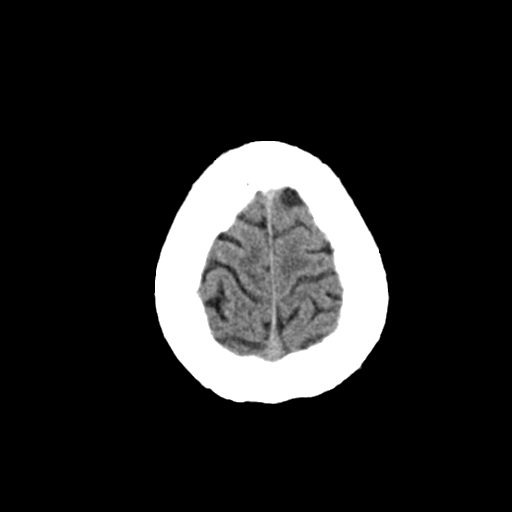
[im 25/31  bone]
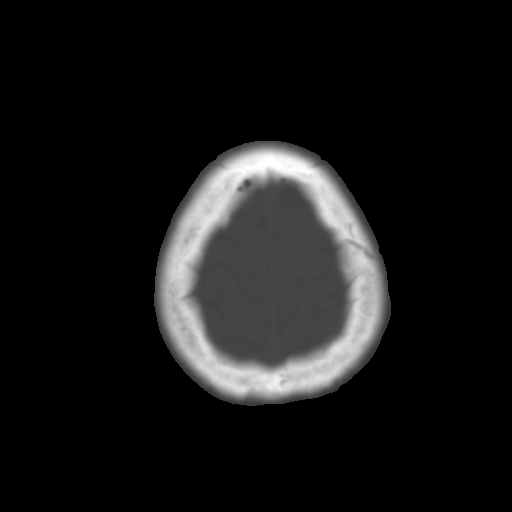
[im 28/31  brain]
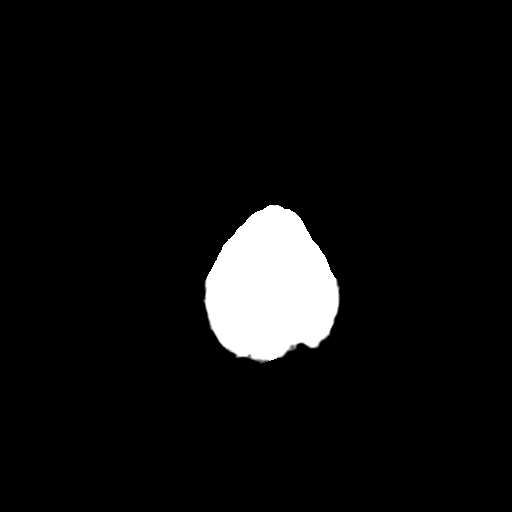

[Series 4: coronal soft · coronal · 0.31mm/px · 3 of 70 slices shown]
[im 24/70  brain]
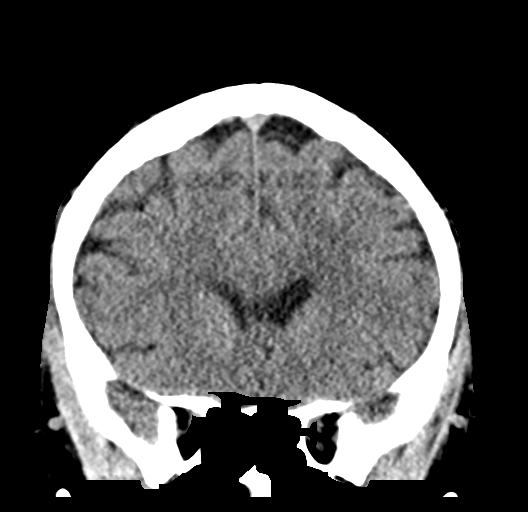
[im 31/70  brain]
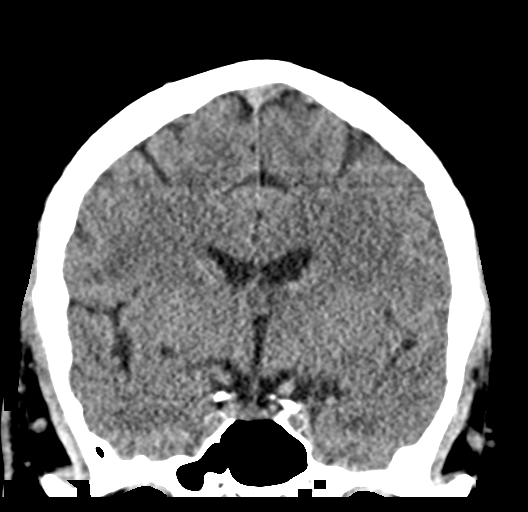
[im 39/70  brain]
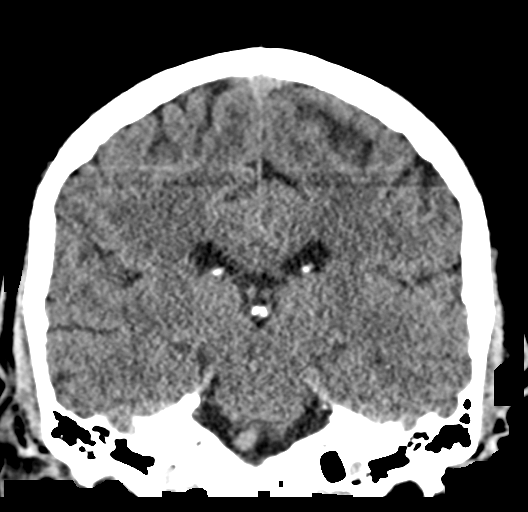

[Series 5: sag soft · sagittal · 0.31mm/px · 3 of 56 slices shown]
[im 19/56  brain]
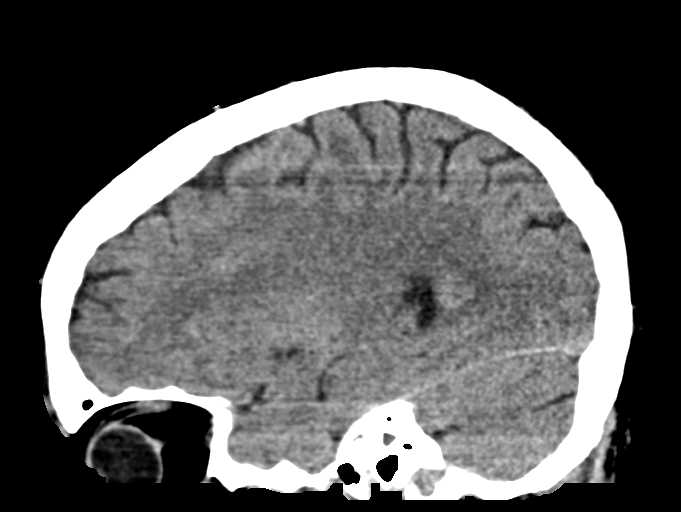
[im 28/56  brain]
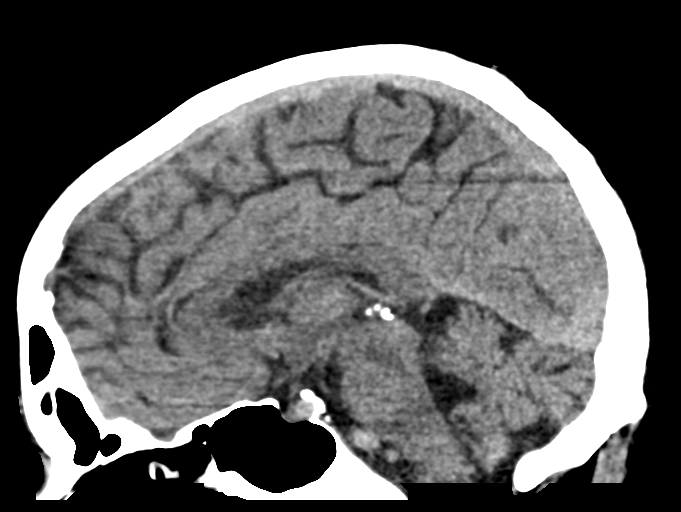
[im 37/56  brain]
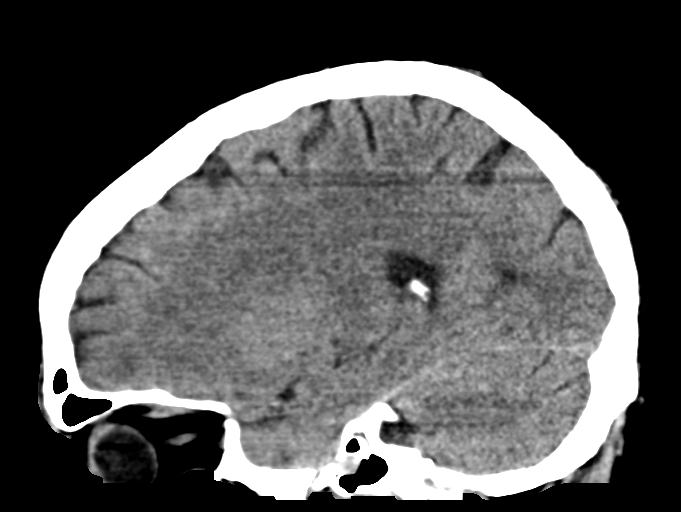

[16 of 47 positions shown; findings below may reference images not displayed]

FINDINGS: Brain: No evidence of acute infarction, hemorrhage, hydrocephalus,
extra-axial collection or mass lesion/mass effect.

Vascular: No hyperdense vessel or unexpected calcification.

Skull: Normal. Negative for fracture or focal lesion.

Sinuses/Orbits: No acute finding.

Other: None.
IMPRESSION: No acute intracranial abnormality.

## 2022-06-05 DIAGNOSIS — I10 Essential (primary) hypertension: Secondary | ICD-10-CM | POA: Diagnosis not present

## 2022-06-05 DIAGNOSIS — Z0000 Encounter for general adult medical examination without abnormal findings: Secondary | ICD-10-CM | POA: Diagnosis not present

## 2023-12-18 DIAGNOSIS — I10 Essential (primary) hypertension: Secondary | ICD-10-CM | POA: Diagnosis not present

## 2023-12-18 DIAGNOSIS — Z6835 Body mass index (BMI) 35.0-35.9, adult: Secondary | ICD-10-CM | POA: Diagnosis not present

## 2023-12-18 DIAGNOSIS — Z0000 Encounter for general adult medical examination without abnormal findings: Secondary | ICD-10-CM | POA: Diagnosis not present

## 2024-01-21 DIAGNOSIS — Z6835 Body mass index (BMI) 35.0-35.9, adult: Secondary | ICD-10-CM | POA: Diagnosis not present

## 2024-01-21 DIAGNOSIS — I10 Essential (primary) hypertension: Secondary | ICD-10-CM | POA: Diagnosis not present

## 2024-01-21 DIAGNOSIS — R7303 Prediabetes: Secondary | ICD-10-CM | POA: Diagnosis not present

## 2024-04-30 DIAGNOSIS — R7303 Prediabetes: Secondary | ICD-10-CM | POA: Diagnosis not present

## 2024-04-30 DIAGNOSIS — I10 Essential (primary) hypertension: Secondary | ICD-10-CM | POA: Diagnosis not present
